# Patient Record
Sex: Male | Born: 1950 | Race: White | Hispanic: No | Marital: Married | Smoking: Never smoker
Health system: Southern US, Community
[De-identification: ages and names within clinical notes are randomized; demographics above are authoritative.]

## PROBLEM LIST (undated history)

## (undated) DIAGNOSIS — N400 Enlarged prostate without lower urinary tract symptoms: Secondary | ICD-10-CM

## (undated) DIAGNOSIS — I10 Essential (primary) hypertension: Secondary | ICD-10-CM

## (undated) DIAGNOSIS — F329 Major depressive disorder, single episode, unspecified: Secondary | ICD-10-CM

## (undated) DIAGNOSIS — I639 Cerebral infarction, unspecified: Secondary | ICD-10-CM

## (undated) DIAGNOSIS — K219 Gastro-esophageal reflux disease without esophagitis: Secondary | ICD-10-CM

## (undated) DIAGNOSIS — D126 Benign neoplasm of colon, unspecified: Secondary | ICD-10-CM

## (undated) DIAGNOSIS — J189 Pneumonia, unspecified organism: Secondary | ICD-10-CM

## (undated) DIAGNOSIS — M1990 Unspecified osteoarthritis, unspecified site: Secondary | ICD-10-CM

## (undated) DIAGNOSIS — I2510 Atherosclerotic heart disease of native coronary artery without angina pectoris: Secondary | ICD-10-CM

## (undated) DIAGNOSIS — N529 Male erectile dysfunction, unspecified: Secondary | ICD-10-CM

## (undated) DIAGNOSIS — C801 Malignant (primary) neoplasm, unspecified: Secondary | ICD-10-CM

## (undated) DIAGNOSIS — E785 Hyperlipidemia, unspecified: Secondary | ICD-10-CM

## (undated) DIAGNOSIS — M109 Gout, unspecified: Secondary | ICD-10-CM

## (undated) DIAGNOSIS — F32A Depression, unspecified: Secondary | ICD-10-CM

## (undated) DIAGNOSIS — F419 Anxiety disorder, unspecified: Secondary | ICD-10-CM

## (undated) DIAGNOSIS — Z87442 Personal history of urinary calculi: Secondary | ICD-10-CM

## (undated) HISTORY — PX: OTHER SURGICAL HISTORY: SHX169

## (undated) HISTORY — DX: Male erectile dysfunction, unspecified: N52.9

## (undated) HISTORY — DX: Gout, unspecified: M10.9

## (undated) HISTORY — DX: Hyperlipidemia, unspecified: E78.5

## (undated) HISTORY — DX: Unspecified osteoarthritis, unspecified site: M19.90

## (undated) HISTORY — DX: Benign prostatic hyperplasia without lower urinary tract symptoms: N40.0

## (undated) HISTORY — PX: ULNAR NERVE REPAIR: SHX2594

## (undated) HISTORY — DX: Essential (primary) hypertension: I10

## (undated) HISTORY — DX: Benign neoplasm of colon, unspecified: D12.6

## (undated) HISTORY — PX: LEG SURGERY: SHX1003

## (undated) HISTORY — DX: Cerebral infarction, unspecified: I63.9

## (undated) HISTORY — PX: TONSILLECTOMY: SUR1361

## (undated) HISTORY — DX: Anxiety disorder, unspecified: F41.9

---

## 1998-12-29 ENCOUNTER — Encounter: Payer: Self-pay | Admitting: *Deleted

## 1998-12-30 ENCOUNTER — Inpatient Hospital Stay (HOSPITAL_COMMUNITY): Admission: EM | Admit: 1998-12-30 | Discharge: 1999-01-04 | Payer: Self-pay | Admitting: *Deleted

## 1998-12-30 ENCOUNTER — Encounter: Payer: Self-pay | Admitting: General Surgery

## 1998-12-30 ENCOUNTER — Encounter: Payer: Self-pay | Admitting: *Deleted

## 2005-07-31 ENCOUNTER — Ambulatory Visit (HOSPITAL_COMMUNITY): Admission: RE | Admit: 2005-07-31 | Discharge: 2005-07-31 | Payer: Self-pay | Admitting: Surgery

## 2005-08-06 ENCOUNTER — Ambulatory Visit (HOSPITAL_COMMUNITY): Admission: RE | Admit: 2005-08-06 | Discharge: 2005-08-06 | Payer: Self-pay | Admitting: Surgery

## 2005-08-07 ENCOUNTER — Ambulatory Visit (HOSPITAL_BASED_OUTPATIENT_CLINIC_OR_DEPARTMENT_OTHER): Admission: RE | Admit: 2005-08-07 | Discharge: 2005-08-07 | Payer: Self-pay | Admitting: Surgery

## 2010-04-20 ENCOUNTER — Encounter: Payer: Self-pay | Admitting: Surgery

## 2011-03-31 DIAGNOSIS — I639 Cerebral infarction, unspecified: Secondary | ICD-10-CM

## 2011-03-31 HISTORY — DX: Cerebral infarction, unspecified: I63.9

## 2013-08-22 DIAGNOSIS — D126 Benign neoplasm of colon, unspecified: Secondary | ICD-10-CM

## 2013-08-22 HISTORY — DX: Benign neoplasm of colon, unspecified: D12.6

## 2016-11-04 ENCOUNTER — Encounter: Payer: Self-pay | Admitting: Nurse Practitioner

## 2016-11-19 ENCOUNTER — Telehealth: Payer: Self-pay | Admitting: Nurse Practitioner

## 2016-11-19 ENCOUNTER — Encounter (INDEPENDENT_AMBULATORY_CARE_PROVIDER_SITE_OTHER): Payer: Self-pay

## 2016-11-19 ENCOUNTER — Ambulatory Visit (INDEPENDENT_AMBULATORY_CARE_PROVIDER_SITE_OTHER): Payer: Medicare Other | Admitting: Nurse Practitioner

## 2016-11-19 ENCOUNTER — Encounter: Payer: Self-pay | Admitting: Nurse Practitioner

## 2016-11-19 VITALS — BP 108/60 | HR 67 | Ht 72.0 in | Wt 199.0 lb

## 2016-11-19 DIAGNOSIS — Z86010 Personal history of colonic polyps: Secondary | ICD-10-CM | POA: Diagnosis not present

## 2016-11-19 DIAGNOSIS — K5900 Constipation, unspecified: Secondary | ICD-10-CM

## 2016-11-19 DIAGNOSIS — K589 Irritable bowel syndrome without diarrhea: Secondary | ICD-10-CM | POA: Diagnosis not present

## 2016-11-19 MED ORDER — LINACLOTIDE 72 MCG PO CAPS: 72.0000 ug | ORAL_CAPSULE | Freq: Every day | ORAL | 2 refills | Status: DC

## 2016-11-19 NOTE — Progress Notes (Addendum)
HPI:  Patient is a 66 year old male, new to this practice, with a history of anxiety, depression, melanoma, CVA, GERD, diverticulosis, IBS, and possibly colon polyps. He is here for evaluation of chronically altered bowel habits. Patient gives a long-standing history of irregular bowel movements dating back to his early twenties. Bowel changes are always associated with life stressors. He was evaluated in 2017 in [place]. I do not have the records but polyps were removed at time of colonoscopy. Patient may have had an upper endoscopy. He was having problems with reflux but after starting baking soda mixed with water his symptoms have resolved. Ultimately patient was felt to have irritable bowel syndrome, Bentyl was prescribed. He comes in today wanting to know what else is available for management of IBS. He takes fiber gummies and uses miralax once a week.He alternates between constipation and diarrhea. He is no longer under much stress, exercising and eating well. He doesn't' understand why bowels are still so irregular.     Past Medical History:  Diagnosis Date  . Anxiety   . Arthritis   . BPH (benign prostatic hyperplasia)   . CVA (cerebral vascular accident) ([place])   . ED (erectile dysfunction)   . Gout   . Hyperlipidemia   . Hypertension      Past Surgical History:  Procedure Laterality Date  . LEG SURGERY    . TONSILLECTOMY     Family History  Problem Relation Age of Onset  . Brain cancer Mother   . COPD Father   . Heart attack Paternal Grandmother   . Heart attack Paternal Grandfather    Social History  Substance Use Topics  . Smoking status: Never Smoker  . Smokeless tobacco: Not on file  . Alcohol use 1.2 oz/week    2 Glasses of wine per week   Current Outpatient Prescriptions  Medication Sig Dispense Refill  . atorvastatin (LIPITOR) 40 MG tablet Take 40 mg by mouth daily.    . diclofenac sodium (VOLTAREN) 1 % GEL Apply 2 g topically 3 (three)  times daily.    Marland Kitchen dicyclomine (BENTYL) 10 MG capsule Take 10 mg by mouth 4 (four) times daily -  before meals and at bedtime.    . hydrOXYzine (ATARAX/VISTARIL) 25 MG tablet Take 25 mg by mouth 3 (three) times daily as needed.    . meloxicam (MOBIC) 15 MG tablet Take 15 mg by mouth daily.    . sertraline (ZOLOFT) 100 MG tablet Take 100 mg by mouth daily.    . tadalafil (CIALIS) 20 MG tablet Take 20 mg by mouth daily as needed for erectile dysfunction.    . traZODone (DESYREL) 100 MG tablet Take 100 mg by mouth at bedtime.     No current facility-administered medications for this visit.    No Known Allergies   Review of Systems: Positive for sinus trouble, anxiety, arthritis, depression, sleeping problems and urine leakage. All systems reviewed and negative except where noted in HPI.    Physical Exam: BP 108/60   Pulse 67   Ht 6' (1.829 m)   Wt 199 lb (90.3 kg)   BMI 26.99 kg/m  Constitutional:  Well-developed, white male in no acute distress. Psychiatric: Normal mood and affect. Behavior is normal. EENT: Pupils normal.  Conjunctivae are normal. No scleral icterus. Neck supple.  Cardiovascular: Normal rate, regular rhythm. No edema Pulmonary/chest: Effort normal and breath sounds normal. No wheezing, rales or rhonchi. Abdominal: Soft, nondistended. Nontender. Bowel sounds active throughout.  There are no masses palpable. No hepatomegaly. Lymphadenopathy: No cervical adenopathy noted. Neurological: Alert and oriented to person place and time. Skin: Skin is warm and dry. No rashes noted.   ASSESSMENT AND PLAN: 45.  33 -year-old male with long-standing constipation / bloating / and loose stools. In retrospect he realizes that loose stools are secondary to laxative. The real issue is constipation. Marland Kitchen   obtain GI records from [place]  -Decreased Bentyl to twice daily on an as-needed basis  -Continue gummy fiber -Trial of Linzess 72 mcg -Follow-up with me or Dr. Carlean Purl in 2 months.  Call if having problems in the interim.  Tye Savoy, NP  11/19/2016, 11:06 AM    Agree with Ms. Murdis Flitton's assessment and plan. Gatha Mayer, MD, [facility]   Addendum.  Received EGD / colonoscopy reports from [facility] in [place].  colonoscopy 08/22/13 for evaluation of bowel changes. Extent of exam was to the cecum. Quality of the bowel prep was fair. Two small sessile polyps were removed from the rectum.  A small sessile polyp removed from hepatic flexure. Nonbleeding internal hemorrhoids were found.  EGD 08/22/13 for evaluation of melena. Findings included medium-size nonbleeding erosions in the prepyloric region. No stigmata of recent bleeding. Biopsies were taken. Salmon mucosa was present in the esophagus, biopsies were taken. First part of duodenum and second part of duodenum were normal. Biopsies were taken for evaluation of celiac disease.   GE junction biopsies showed carditis, increased acute and chronic inflammation. No malignancy.  Gastric biopsy showed benign gastric mucosa with hemorrhage. No H. pylori.  Small bowel biopsy showed mild chronic active duodenitis with increased acute and chronic inflammation. Preserved villous architecture. No features of sprue.  Hepatic flexure polyp was a tubular adenoma.  Rectal polyp was hyperplastic.  Will scan report sent Epic and [place] addendum to Dr. Carlean Purl, patient's primary GI. Probably needs surveillance colonoscopy in 2020 but will defer to Dr. Carlean Purl    Noted  Consider earlier f/u colonoscopy given fair prep Gatha Mayer, MD, [facility]

## 2016-11-19 NOTE — Patient Instructions (Addendum)
If you are age 67 or older, your body mass index should be between 23-30. Your Body mass index is 26.99 kg/m. If this is out of the aforementioned range listed, please consider follow up with your Primary Care Provider.  If you are age 44 or younger, your body mass index should be between 19-25. Your Body mass index is 26.99 kg/m. If this is out of the aformentioned range listed, please consider follow up with your Primary Care Provider.   We have sent the following medications to your pharmacy for you to pick up at your convenience: [facility] card given. DECREASE Bentyl to ONLY twice daily as needed.  Follow up with Dr Carlean Purl on 01/25/17 at 11:00 a.m.  Thank you for choosing me and [facility].   Tye Savoy, NP

## 2016-11-20 NOTE — Telephone Encounter (Signed)
Patient states that he cannot afford Linzess and is requesting something less espensive. Best # [phone].

## 2016-11-24 NOTE — Telephone Encounter (Signed)
Hi Paula, please advise alternative for LInzess , pt could not use coupon. Thanks, Peter Congo

## 2016-11-26 ENCOUNTER — Other Ambulatory Visit: Payer: Self-pay

## 2016-11-26 MED ORDER — LUBIPROSTONE 8 MCG PO CAPS: 8.0000 ug | ORAL_CAPSULE | Freq: Two times a day (BID) | ORAL | 0 refills | Status: DC

## 2016-11-26 NOTE — Telephone Encounter (Signed)
Okay, lets try amitiza 8 mcg BID with food. Thanks # [phone]

## 2016-12-25 ENCOUNTER — Ambulatory Visit: Payer: Self-pay | Admitting: Orthopedic Surgery

## 2016-12-31 ENCOUNTER — Ambulatory Visit: Payer: Self-pay | Admitting: Orthopedic Surgery

## 2016-12-31 NOTE — H&P (Signed)
Harry Olson DOB: 10-22-1950 Separated / Language: Cleophus Molt / Race: White Male Date of Admission:  01/11/2017 CC:  Left knee pain History of Present Illness The patient is a 66 year old male who comes in for a preoperative History and Physical. The patient is scheduled for a total knee arthroplasty to be performed by Dr. Dione Plover. Aluisio, MD at [facility] on 01-11-2017. The patient was seen for a second opinion. The patient reported left knee symptoms including: pain, swelling, instability, giving way and weakness which began year(s) ago without any known injury. The patient describes their pain as sharp, dull, aching and throbbing. The patient feels that the symptoms are worsening. Prior to being seen, the patient was previously evaluated by a colleague [place], [facility]). Previous work-up for this problem has included knee x-rays. Past treatment for this problem has included intra-articular injection of corticosteroids and viscosupplementation. Current treatment includes nonsteroidal anti-inflammatory drugs (Aleve/Ibuprofen). The left knee has been a problem for several years and has been more progressive over the past 6-12 months. He was been followed and treated by his physician in [place], [place] and had received a couple of cortisone injections that helped and was in the process and received two out of a three shot gel series prior to moving to [place]. He was referred to Dr. Wynelle Link by several people.  He describes the pain as sharp at times with a more constant dull ache. He has had to modify his activities over the past two years and has had to modify his workouts. 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 He has had swelling, popping, grinding, but no buckling or giving way. He describes significant stiffness and unable to kneel down. He has now reached a point where he would like to proceed with surgery for the knee. They have been treated  conservatively in the past for the above stated problem and despite conservative measures, they continue to have progressive pain and severe functional limitations and dysfunction. They have failed non-operative management including home exercise, medications, and injections. It is felt that they would benefit from undergoing total joint replacement. Risks and benefits of the procedure have been discussed with the patient and they elect to proceed with surgery. There are no active contraindications to surgery such as ongoing infection or rapidly progressive neurological disease.  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 age 27, smoker, emphysema Mother  Deceased. age 52, Brain Tumor  Social History Post-Surgical Plans  Home With Caregiver, Home with HHPT. Children  2  Medication History Atorvastatin Calcium (40MG  Tablet, Oral) Active. HydrOXYzine Pamoate (25MG  Capsule, Oral) Active. Meloxicam (15MG  Tablet, Oral) Active. Sertraline HCl (100MG  Tablet, Oral) Active. Sildenafil Citrate (20MG  Tablet, Oral) Active. TraZODone HCl (100MG  Tablet, Oral) Active.    Review of Systems  General Not Present- Chills, Fatigue, Fever, Memory Loss, Night Sweats, Weight Gain and Weight Loss. Skin Not Present- Eczema, Hives, Itching, Lesions and Rash. HEENT Not Present- Dentures, Double Vision, Headache, Hearing Loss, Tinnitus and Visual Loss. Respiratory Present- Cough (recent cough but resolved). Not Present- Allergies, Chronic Cough, Coughing up blood, Shortness of breath at rest and Shortness of breath with exertion. Cardiovascular Not Present- Chest Pain, Difficulty Breathing Lying Down, Murmur, Palpitations, Racing/skipping heartbeats and Swelling. Gastrointestinal Not Present- Abdominal Pain,  Bloody Stool, Constipation,  Diarrhea, Difficulty Swallowing, Heartburn, Jaundice, Loss of appetitie, Nausea and Vomiting. Male Genitourinary Not Present- Blood in Urine, Discharge, Flank Pain, Incontinence, Painful Urination, Urgency, Urinary frequency, Urinary Retention, Urinating at Night and Weak urinary stream. Musculoskeletal Present- Joint Pain. Not Present- Back Pain, Joint Swelling, Morning Stiffness, Muscle Pain, Muscle Weakness and Spasms. Neurological Not Present- Blackout spells, Difficulty with balance, Dizziness, Paralysis, Tremor and Weakness. Psychiatric Not Present- Insomnia.  Vitals Weight: 196 lb Height: 70in Weight was reported by patient. Height was reported by patient. Body Surface Area: 2.07 m Body Mass Index: 28.12 kg/m  Pulse: 68 (Regular)  BP: 118/68 (Sitting, Right Arm, Standard)   Physical Exam General Mental Status -Alert, cooperative and good historian. General Appearance-pleasant, Not in acute distress. Orientation-Oriented X3. Build & Nutrition-Well nourished and Well developed.  Head and Neck Head-normocephalic, atraumatic . Neck Global Assessment - supple, no bruit auscultated on the right, no bruit auscultated on the left.  Eye Pupil - Bilateral-Regular and Round. Motion - Bilateral-EOMI.  ENMT Note: upper and lower dentures   Chest and Lung Exam Auscultation Breath sounds - clear at anterior chest wall and clear at posterior chest wall. Adventitious sounds - No Adventitious sounds.  Cardiovascular Auscultation Rhythm - Regular rate and rhythm. Heart Sounds - S1 WNL and S2 WNL. Murmurs & Other Heart Sounds - Auscultation of the heart reveals - No Murmurs.  Abdomen Palpation/Percussion Tenderness - Abdomen is non-tender to palpation. Rigidity (guarding) - Abdomen is soft. Auscultation Auscultation of the abdomen reveals - Bowel sounds normal.  Male Genitourinary Note: Not done, not pertinent to present  illness   Musculoskeletal Note: Well-developed male alert and oriented in no apparent distress. Both hips have normal range of motion. He has significant tenderness over the greater trochanter on both sides.Right knee shows no effusion. Range of motion is 0-125. There is no crepitus on range of motion of the knee. There is no medial or lateral joint line tenderness. There is no instability noted. His LEFT knee shows no effusion. His range is 5-125. There is moderate crepitus on range of motion of the knee. He is tender medial and lateral with no instability noted. He has an antalgic gait pattern on the LEFT.  Radiographs AP and lateral of the knees show RIGHT knee is normal. LEFT knee has bone-on-bone change in the lateral and patellofemoral compartments. AP pelvis and lateral hip show no arthritis acute or chronic bony abnormalities   Assessment & Plan Primary osteoarthritis of left knee (M17.12)  Note:Surgical Plans: Left Total Knee Replacement  Disposition: Home with home, HHPT  PCP: Teodoro Kil, PA-C - Patient has been seen preoperatively and felt to be stable for surgery.  IV TXA  Anesthesia Issues: None  Patient was instructed on what medications to stop prior to surgery.  Signed electronically by Joelene Millin, III PA-C

## 2017-01-01 NOTE — Progress Notes (Signed)
Please place orders in EPIC as patient has a pre-op appointment on 01/05/2017! Thank you!

## 2017-01-01 NOTE — Patient Instructions (Signed)
Harry Olson  01/01/2017   Your procedure is scheduled on: 01/11/17    Report to [facility] Main  Entrance   Report to admitting at   1010 AM   Call this number if you have problems the morning of surgery  [phone]   Remember: ONLY 1 PERSON MAY GO WITH YOU TO SHORT STAY TO GET  READY MORNING OF YOUR SURGERY.  Do not eat food or drink liquids :After Midnight.     Take these medicines the morning of surgery with A SIP OF WATER: Zoloft                                 You may not have any metal on your body including hair pins and              piercings  Do not wear jewelry, , lotions, powders or perfumes, deodorant                      Men may shave face and neck.   Do not bring valuables to the hospital. [place].  Contacts, dentures or bridgework may not be worn into surgery.  Leave suitcase in the car. After surgery it may be brought to your room.                 Please read over the following fact sheets you were given: _____________________________________________________________________             [facility] - Preparing for Surgery Before surgery, you can play an important role.  Because skin is not sterile, your skin needs to be as free of germs as possible.  You can reduce the number of germs on your skin by washing with CHG (chlorahexidine gluconate) soap before surgery.  CHG is an antiseptic cleaner which kills germs and bonds with the skin to continue killing germs even after washing. Please DO NOT use if you have an allergy to CHG or antibacterial soaps.  If your skin becomes reddened/irritated stop using the CHG and inform your nurse when you arrive at Short Stay. Do not shave (including legs and underarms) for at least 48 hours prior to the first CHG shower.  You may shave your face/neck. Please follow these instructions carefully:  1.  Shower with CHG Soap the night before surgery  and the  morning of Surgery.  2.  If you choose to wash your hair, wash your hair first as usual with your  normal  shampoo.  3.  After you shampoo, rinse your hair and body thoroughly to remove the  shampoo.                           4.  Use CHG as you would any other liquid soap.  You can apply chg directly  to the skin and wash                       Gently with a scrungie or clean washcloth.  5.  Apply the CHG Soap to your body ONLY FROM THE NECK DOWN.   Do not use on face/ open  Wound or open sores. Avoid contact with eyes, ears mouth and genitals (private parts).                       Wash face,  Genitals (private parts) with your normal soap.             6.  Wash thoroughly, paying special attention to the area where your surgery  will be performed.  7.  Thoroughly rinse your body with warm water from the neck down.  8.  DO NOT shower/wash with your normal soap after using and rinsing off  the CHG Soap.                9.  Pat yourself dry with a clean towel.            10.  Wear clean pajamas.            11.  Place clean sheets on your bed the night of your first shower and do not  sleep with pets. Day of Surgery : Do not apply any lotions/deodorants the morning of surgery.  Please wear clean clothes to the hospital/surgery center.  FAILURE TO FOLLOW THESE INSTRUCTIONS MAY RESULT IN THE CANCELLATION OF YOUR SURGERY PATIENT SIGNATURE_________________________________  NURSE SIGNATURE__________________________________  ________________________________________________________________________  WHAT IS A BLOOD TRANSFUSION? Blood Transfusion Information  A transfusion is the replacement of blood or some of its parts. Blood is made up of multiple cells which provide different functions.  Red blood cells carry oxygen and are used for blood loss replacement.  White blood cells fight against infection.  Platelets control bleeding.  Plasma helps clot  blood.  Other blood products are available for specialized needs, such as hemophilia or other clotting disorders. BEFORE THE TRANSFUSION  Who gives blood for transfusions?   Healthy volunteers who are fully evaluated to make sure their blood is safe. This is blood bank blood. Transfusion therapy is the safest it has ever been in the practice of medicine. Before blood is taken from a donor, a complete history is taken to make sure that person has no history of diseases nor engages in risky social behavior (examples are intravenous drug use or sexual activity with multiple partners). The donor's travel history is screened to minimize risk of transmitting infections, such as malaria. The donated blood is tested for signs of infectious diseases, such as HIV and hepatitis. The blood is then tested to be sure it is compatible with you in order to minimize the chance of a transfusion reaction. If you or a relative donates blood, this is often done in anticipation of surgery and is not appropriate for emergency situations. It takes many days to process the donated blood. RISKS AND COMPLICATIONS Although transfusion therapy is very safe and saves many lives, the main dangers of transfusion include:   Getting an infectious disease.  Developing a transfusion reaction. This is an allergic reaction to something in the blood you were given. Every precaution is taken to prevent this. The decision to have a blood transfusion has been considered carefully by your caregiver before blood is given. Blood is not given unless the benefits outweigh the risks. AFTER THE TRANSFUSION  Right after receiving a blood transfusion, you will usually feel much better and more energetic. This is especially true if your red blood cells have gotten low (anemic). The transfusion raises the level of the red blood cells which carry oxygen, and this usually causes an energy increase.  The  nurse administering the transfusion will monitor  you carefully for complications. HOME CARE INSTRUCTIONS  No special instructions are needed after a transfusion. You may find your energy is better. Speak with your caregiver about any limitations on activity for underlying diseases you may have. SEEK MEDICAL CARE IF:   Your condition is not improving after your transfusion.  You develop redness or irritation at the intravenous (IV) site. SEEK IMMEDIATE MEDICAL CARE IF:  Any of the following symptoms occur over the next 12 hours:  Shaking chills.  You have a temperature by mouth above 102 F (38.9 C), not controlled by medicine.  Chest, back, or muscle pain.  People around you feel you are not acting correctly or are confused.  Shortness of breath or difficulty breathing.  Dizziness and fainting.  You get a rash or develop hives.  You have a decrease in urine output.  Your urine turns a dark color or changes to pink, red, or brown. Any of the following symptoms occur over the next 10 days:  You have a temperature by mouth above 102 F (38.9 C), not controlled by medicine.  Shortness of breath.  Weakness after normal activity.  The white part of the eye turns yellow (jaundice).  You have a decrease in the amount of urine or are urinating less often.  Your urine turns a dark color or changes to pink, red, or brown. Document Released: 03/13/2000 Document Revised: 06/08/2011 Document Reviewed: 10/31/2007 ExitCare Patient Information 2014 [place].  _______________________________________________________________________  Incentive Spirometer  An incentive spirometer is a tool that can help keep your lungs clear and active. This tool measures how well you are filling your lungs with each breath. Taking long deep breaths may help reverse or decrease the chance of developing breathing (pulmonary) problems (especially infection) following:  A long period of time when you are unable to move or be active. BEFORE THE  PROCEDURE   If the spirometer includes an indicator to show your best effort, your nurse or respiratory therapist will set it to a desired goal.  If possible, sit up straight or lean slightly forward. Try not to slouch.  Hold the incentive spirometer in an upright position. INSTRUCTIONS FOR USE  1. Sit on the edge of your bed if possible, or sit up as far as you can in bed or on a chair. 2. Hold the incentive spirometer in an upright position. 3. Breathe out normally. 4. Place the mouthpiece in your mouth and seal your lips tightly around it. 5. Breathe in slowly and as deeply as possible, raising the piston or the ball toward the top of the column. 6. Hold your breath for 3-5 seconds or for as long as possible. Allow the piston or ball to fall to the bottom of the column. 7. Remove the mouthpiece from your mouth and breathe out normally. 8. Rest for a few seconds and repeat Steps 1 through 7 at least 10 times every 1-2 hours when you are awake. Take your time and take a few normal breaths between deep breaths. 9. The spirometer may include an indicator to show your best effort. Use the indicator as a goal to work toward during each repetition. 10. After each set of 10 deep breaths, practice coughing to be sure your lungs are clear. If you have an incision (the cut made at the time of surgery), support your incision when coughing by placing a pillow or rolled up towels firmly against it. Once you are able to get  out of bed, walk around indoors and cough well. You may stop using the incentive spirometer when instructed by your caregiver.  RISKS AND COMPLICATIONS  Take your time so you do not get dizzy or light-headed.  If you are in pain, you may need to take or ask for pain medication before doing incentive spirometry. It is harder to take a deep breath if you are having pain. AFTER USE  Rest and breathe slowly and easily.  It can be helpful to keep track of a log of your progress. Your  caregiver can provide you with a simple table to help with this. If you are using the spirometer at home, follow these instructions: [place] IF:   You are having difficultly using the spirometer.  You have trouble using the spirometer as often as instructed.  Your pain medication is not giving enough relief while using the spirometer.  You develop fever of 100.5 F (38.1 C) or higher. SEEK IMMEDIATE MEDICAL CARE IF:   You cough up bloody sputum that had not been present before.  You develop fever of 102 F (38.9 C) or greater.  You develop worsening pain at or near the incision site. MAKE SURE YOU:   Understand these instructions.  Will watch your condition.  Will get help right away if you are not doing well or get worse. Document Released: 07/27/2006 Document Revised: 06/08/2011 Document Reviewed: 09/27/2006 [employer] Patient Information 2014 [place], [place].   ________________________________________________________________________

## 2017-01-04 ENCOUNTER — Ambulatory Visit: Payer: Self-pay | Admitting: Orthopedic Surgery

## 2017-01-04 NOTE — Progress Notes (Signed)
Please place orders in EPIC as patient has a pre-op appointment on 01/05/2017! Thank you!

## 2017-01-05 ENCOUNTER — Encounter (HOSPITAL_COMMUNITY): Payer: Self-pay

## 2017-01-05 ENCOUNTER — Encounter (INDEPENDENT_AMBULATORY_CARE_PROVIDER_SITE_OTHER): Payer: Self-pay

## 2017-01-05 ENCOUNTER — Encounter (HOSPITAL_COMMUNITY)
Admission: RE | Admit: 2017-01-05 | Discharge: 2017-01-05 | Disposition: A | Discharge status: Home / Self Care | Payer: Medicare Other | Source: Ambulatory Visit | Attending: Orthopedic Surgery | Admitting: Orthopedic Surgery

## 2017-01-05 ENCOUNTER — Ambulatory Visit: Payer: Self-pay | Admitting: Orthopedic Surgery

## 2017-01-05 DIAGNOSIS — M1712 Unilateral primary osteoarthritis, left knee: Secondary | ICD-10-CM | POA: Diagnosis not present

## 2017-01-05 DIAGNOSIS — Z01812 Encounter for preprocedural laboratory examination: Secondary | ICD-10-CM | POA: Diagnosis present

## 2017-01-05 DIAGNOSIS — Z01810 Encounter for preprocedural cardiovascular examination: Secondary | ICD-10-CM | POA: Insufficient documentation

## 2017-01-05 HISTORY — DX: Malignant (primary) neoplasm, unspecified: C80.1

## 2017-01-05 HISTORY — DX: Depression, unspecified: F32.A

## 2017-01-05 HISTORY — DX: Major depressive disorder, single episode, unspecified: F32.9

## 2017-01-05 HISTORY — DX: Pneumonia, unspecified organism: J18.9

## 2017-01-05 HISTORY — DX: Personal history of urinary calculi: Z87.442

## 2017-01-05 HISTORY — DX: Gastro-esophageal reflux disease without esophagitis: K21.9

## 2017-01-05 LAB — COMPREHENSIVE METABOLIC PANEL
ALBUMIN: 4 g/dL (ref 3.5–5.0)
ALT: 14 U/L — AB (ref 17–63)
AST: 15 U/L (ref 15–41)
Alkaline Phosphatase: 54 U/L (ref 38–126)
Anion gap: 12 (ref 5–15)
BUN: 18 mg/dL (ref 6–20)
CHLORIDE: 106 mmol/L (ref 101–111)
CO2: 24 mmol/L (ref 22–32)
CREATININE: 1.13 mg/dL (ref 0.61–1.24)
Calcium: 9.5 mg/dL (ref 8.9–10.3)
GFR calc Af Amer: >60 mL/min (ref >60)
GLUCOSE: 94 mg/dL (ref 65–99)
POTASSIUM: 3.9 mmol/L (ref 3.5–5.1)
Sodium: 142 mmol/L (ref 135–145)
Total Bilirubin: 1.3 mg/dL — ABNORMAL HIGH (ref 0.3–1.2)
Total Protein: 7 g/dL (ref 6.5–8.1)

## 2017-01-05 LAB — CBC
HEMATOCRIT: 42.9 % (ref 39.0–52.0)
Hemoglobin: 14.2 g/dL (ref 13.0–17.0)
MCH: 29.4 pg (ref 26.0–34.0)
MCHC: 33.1 g/dL (ref 30.0–36.0)
MCV: 88.8 fL (ref 78.0–100.0)
PLATELETS: 182 10*3/uL (ref 150–400)
RBC: 4.83 MIL/uL (ref 4.22–5.81)
RDW: 13 % (ref 11.5–15.5)
WBC: 6.8 10*3/uL (ref 4.0–10.5)

## 2017-01-05 LAB — SURGICAL PCR SCREEN
MRSA, PCR: NEGATIVE
STAPHYLOCOCCUS AUREUS: POSITIVE — AB

## 2017-01-05 LAB — PROTIME-INR
INR: 0.91
PROTHROMBIN TIME: 12.2 s (ref 11.4–15.2)

## 2017-01-05 LAB — APTT: APTT: 37 s — AB (ref 24–36)

## 2017-01-05 LAB — ABO/RH: ABO/RH(D): O POS

## 2017-01-05 NOTE — Progress Notes (Addendum)
LOV- 7/25/18[name]  In epic  Clearance- Harry Olson on chart 10/21/16

## 2017-01-05 NOTE — Progress Notes (Signed)
Clearance- 10/25/2016- Abbe Amsterdam on chart

## 2017-01-05 NOTE — Progress Notes (Signed)
Final EKG done 01/05/17-epic

## 2017-01-05 NOTE — Progress Notes (Signed)
PTT done 01/05/2017 faxed via epic to Dr Wynelle Link.

## 2017-01-06 NOTE — Progress Notes (Deleted)
Requeested LOV note from [facility].

## 2017-01-11 ENCOUNTER — Encounter (HOSPITAL_COMMUNITY): Payer: Self-pay

## 2017-01-11 ENCOUNTER — Inpatient Hospital Stay (HOSPITAL_COMMUNITY): Payer: Medicare Other | Admitting: Registered Nurse

## 2017-01-11 ENCOUNTER — Inpatient Hospital Stay (HOSPITAL_COMMUNITY)
Admission: RE | Admit: 2017-01-11 | Discharge: 2017-01-13 | DRG: 470 | Disposition: A | Discharge status: Home / Self Care | Payer: Medicare Other | Source: Ambulatory Visit | Attending: Orthopedic Surgery | Admitting: Orthopedic Surgery

## 2017-01-11 ENCOUNTER — Encounter (HOSPITAL_COMMUNITY)
Admission: RE | Disposition: A | Discharge status: Home / Self Care | Payer: Self-pay | Source: Ambulatory Visit | Attending: Orthopedic Surgery

## 2017-01-11 DIAGNOSIS — Z825 Family history of asthma and other chronic lower respiratory diseases: Secondary | ICD-10-CM

## 2017-01-11 DIAGNOSIS — Z87442 Personal history of urinary calculi: Secondary | ICD-10-CM | POA: Diagnosis not present

## 2017-01-11 DIAGNOSIS — M109 Gout, unspecified: Secondary | ICD-10-CM | POA: Diagnosis present

## 2017-01-11 DIAGNOSIS — Z85820 Personal history of malignant melanoma of skin: Secondary | ICD-10-CM

## 2017-01-11 DIAGNOSIS — Z8673 Personal history of transient ischemic attack (TIA), and cerebral infarction without residual deficits: Secondary | ICD-10-CM | POA: Diagnosis not present

## 2017-01-11 DIAGNOSIS — Z8701 Personal history of pneumonia (recurrent): Secondary | ICD-10-CM

## 2017-01-11 DIAGNOSIS — M1712 Unilateral primary osteoarthritis, left knee: Principal | ICD-10-CM | POA: Diagnosis present

## 2017-01-11 DIAGNOSIS — M1710 Unilateral primary osteoarthritis, unspecified knee: Secondary | ICD-10-CM | POA: Diagnosis present

## 2017-01-11 DIAGNOSIS — G8929 Other chronic pain: Secondary | ICD-10-CM | POA: Diagnosis present

## 2017-01-11 DIAGNOSIS — M179 Osteoarthritis of knee, unspecified: Secondary | ICD-10-CM

## 2017-01-11 HISTORY — PX: TOTAL KNEE ARTHROPLASTY: SHX125

## 2017-01-11 LAB — TYPE AND SCREEN
ABO/RH(D): O POS
ANTIBODY SCREEN: NEGATIVE

## 2017-01-11 SURGERY — ARTHROPLASTY, KNEE, TOTAL
Anesthesia: Spinal | Site: Knee | Laterality: Left

## 2017-01-11 MED ORDER — ONDANSETRON HCL 4 MG PO TABS: 4.0000 mg | ORAL_TABLET | Freq: Four times a day (QID) | ORAL | Status: DC | PRN

## 2017-01-11 MED ORDER — FLEET ENEMA 7-19 GM/118ML RE ENEM: 1.0000 | ENEMA | Freq: Once | RECTAL | Status: DC | PRN

## 2017-01-11 MED ORDER — LACTATED RINGERS IV SOLN: INTRAVENOUS | Status: DC

## 2017-01-11 MED ORDER — LIDOCAINE 2% (20 MG/ML) 5 ML SYRINGE
INTRAMUSCULAR | Status: DC | PRN
  Administered 2017-01-11: 40 mg via INTRAVENOUS

## 2017-01-11 MED ORDER — SODIUM CHLORIDE 0.9 % IR SOLN
Status: DC | PRN
  Administered 2017-01-11: 1000 mL

## 2017-01-11 MED ORDER — CHLORHEXIDINE GLUCONATE 4 % EX LIQD: 60.0000 mL | Freq: Once | CUTANEOUS | Status: DC

## 2017-01-11 MED ORDER — ACETAMINOPHEN 10 MG/ML IV SOLN
INTRAVENOUS | Status: AC
  Filled 2017-01-11: qty 100

## 2017-01-11 MED ORDER — GABAPENTIN 300 MG PO CAPS: 300.0000 mg | ORAL_CAPSULE | Freq: Once | ORAL | Status: DC

## 2017-01-11 MED ORDER — ACETAMINOPHEN 10 MG/ML IV SOLN: 1000.0000 mg | Freq: Once | INTRAVENOUS | Status: DC

## 2017-01-11 MED ORDER — CEFAZOLIN SODIUM-DEXTROSE 2-4 GM/100ML-% IV SOLN
2.0000 g | Freq: Four times a day (QID) | INTRAVENOUS | Status: AC
  Administered 2017-01-11 – 2017-01-12 (×2): 2 g via INTRAVENOUS
  Filled 2017-01-11 (×2): qty 100

## 2017-01-11 MED ORDER — LACTATED RINGERS IV SOLN
INTRAVENOUS | Status: DC
  Administered 2017-01-11 (×2): via INTRAVENOUS

## 2017-01-11 MED ORDER — MORPHINE SULFATE (PF) 4 MG/ML IV SOLN
1.0000 mg | INTRAVENOUS | Status: DC | PRN
  Administered 2017-01-11 (×2): 1 mg via INTRAVENOUS
  Filled 2017-01-11 (×3): qty 1

## 2017-01-11 MED ORDER — ONDANSETRON HCL 4 MG/2ML IJ SOLN
INTRAMUSCULAR | Status: AC
  Filled 2017-01-11: qty 2

## 2017-01-11 MED ORDER — SERTRALINE HCL 100 MG PO TABS
100.0000 mg | ORAL_TABLET | Freq: Every day | ORAL | Status: DC
  Administered 2017-01-12 – 2017-01-13 (×2): 100 mg via ORAL
  Filled 2017-01-11 (×2): qty 1

## 2017-01-11 MED ORDER — ONDANSETRON HCL 4 MG/2ML IJ SOLN
4.0000 mg | Freq: Four times a day (QID) | INTRAMUSCULAR | Status: DC | PRN
  Administered 2017-01-12: 11:00:00 4 mg via INTRAVENOUS
  Filled 2017-01-11: qty 2

## 2017-01-11 MED ORDER — DEXAMETHASONE SODIUM PHOSPHATE 10 MG/ML IJ SOLN: 10.0000 mg | Freq: Once | INTRAMUSCULAR | Status: DC

## 2017-01-11 MED ORDER — CEFAZOLIN SODIUM-DEXTROSE 2-4 GM/100ML-% IV SOLN
INTRAVENOUS | Status: AC
  Filled 2017-01-11: qty 100

## 2017-01-11 MED ORDER — PROMETHAZINE HCL 25 MG/ML IJ SOLN: 6.2500 mg | INTRAMUSCULAR | Status: DC | PRN

## 2017-01-11 MED ORDER — BUPIVACAINE IN DEXTROSE 0.75-8.25 % IT SOLN
INTRATHECAL | Status: DC | PRN
  Administered 2017-01-11: 2 mL via INTRATHECAL

## 2017-01-11 MED ORDER — PROPOFOL 10 MG/ML IV BOLUS
INTRAVENOUS | Status: DC | PRN
  Administered 2017-01-11 (×2): 20 mg via INTRAVENOUS

## 2017-01-11 MED ORDER — MIDAZOLAM HCL 2 MG/2ML IJ SOLN
2.0000 mg | Freq: Once | INTRAMUSCULAR | Status: AC
  Administered 2017-01-11: 1 mg via INTRAVENOUS

## 2017-01-11 MED ORDER — DIPHENHYDRAMINE HCL 12.5 MG/5ML PO ELIX: 12.5000 mg | ORAL_SOLUTION | ORAL | Status: DC | PRN

## 2017-01-11 MED ORDER — ATORVASTATIN CALCIUM 40 MG PO TABS
40.0000 mg | ORAL_TABLET | ORAL | Status: DC
  Administered 2017-01-11 – 2017-01-12 (×2): 40 mg via ORAL
  Filled 2017-01-11 (×2): qty 1

## 2017-01-11 MED ORDER — METOCLOPRAMIDE HCL 5 MG/ML IJ SOLN: 5.0000 mg | Freq: Three times a day (TID) | INTRAMUSCULAR | Status: DC | PRN

## 2017-01-11 MED ORDER — BUPIVACAINE LIPOSOME 1.3 % IJ SUSP
20.0000 mL | Freq: Once | INTRAMUSCULAR | Status: DC
  Filled 2017-01-11: qty 20

## 2017-01-11 MED ORDER — METOCLOPRAMIDE HCL 5 MG PO TABS: 5.0000 mg | ORAL_TABLET | Freq: Three times a day (TID) | ORAL | Status: DC | PRN

## 2017-01-11 MED ORDER — GABAPENTIN 300 MG PO CAPS
300.0000 mg | ORAL_CAPSULE | Freq: Once | ORAL | Status: AC
  Administered 2017-01-11: 300 mg via ORAL

## 2017-01-11 MED ORDER — METHOCARBAMOL 500 MG PO TABS
500.0000 mg | ORAL_TABLET | Freq: Four times a day (QID) | ORAL | Status: DC | PRN
  Administered 2017-01-11 – 2017-01-13 (×7): 500 mg via ORAL
  Filled 2017-01-11 (×7): qty 1

## 2017-01-11 MED ORDER — BUPIVACAINE LIPOSOME 1.3 % IJ SUSP: 20.0000 mL | Freq: Once | INTRAMUSCULAR | Status: DC

## 2017-01-11 MED ORDER — ACETAMINOPHEN 10 MG/ML IV SOLN
1000.0000 mg | Freq: Once | INTRAVENOUS | Status: AC
  Administered 2017-01-11: 1000 mg via INTRAVENOUS

## 2017-01-11 MED ORDER — DOCUSATE SODIUM 100 MG PO CAPS
100.0000 mg | ORAL_CAPSULE | Freq: Two times a day (BID) | ORAL | Status: DC
  Administered 2017-01-11 – 2017-01-13 (×4): 100 mg via ORAL
  Filled 2017-01-11 (×4): qty 1

## 2017-01-11 MED ORDER — ACETAMINOPHEN 325 MG PO TABS
650.0000 mg | ORAL_TABLET | Freq: Four times a day (QID) | ORAL | Status: DC | PRN
  Administered 2017-01-13: 01:00:00 650 mg via ORAL
  Filled 2017-01-11: qty 2

## 2017-01-11 MED ORDER — DEXAMETHASONE SODIUM PHOSPHATE 10 MG/ML IJ SOLN
10.0000 mg | Freq: Once | INTRAMUSCULAR | Status: AC
  Administered 2017-01-11: 10 mg via INTRAVENOUS

## 2017-01-11 MED ORDER — ACETAMINOPHEN 650 MG RE SUPP
650.0000 mg | Freq: Four times a day (QID) | RECTAL | Status: DC | PRN
  Filled 2017-01-11: qty 1

## 2017-01-11 MED ORDER — CEFAZOLIN SODIUM-DEXTROSE 2-4 GM/100ML-% IV SOLN
2.0000 g | INTRAVENOUS | Status: AC
  Administered 2017-01-11: 2 g via INTRAVENOUS

## 2017-01-11 MED ORDER — BISACODYL 10 MG RE SUPP: 10.0000 mg | Freq: Every day | RECTAL | Status: DC | PRN

## 2017-01-11 MED ORDER — ONDANSETRON HCL 4 MG/2ML IJ SOLN
INTRAMUSCULAR | Status: DC | PRN
  Administered 2017-01-11: 4 mg via INTRAVENOUS

## 2017-01-11 MED ORDER — MIDAZOLAM HCL 2 MG/2ML IJ SOLN
INTRAMUSCULAR | Status: AC
  Administered 2017-01-11: 1 mg via INTRAVENOUS
  Filled 2017-01-11: qty 2

## 2017-01-11 MED ORDER — TRANEXAMIC ACID 1000 MG/10ML IV SOLN
1000.0000 mg | Freq: Once | INTRAVENOUS | Status: AC
  Administered 2017-01-11: 1000 mg via INTRAVENOUS
  Filled 2017-01-11: qty 1100

## 2017-01-11 MED ORDER — PROPOFOL 500 MG/50ML IV EMUL
INTRAVENOUS | Status: DC | PRN
  Administered 2017-01-11: 75 ug/kg/min via INTRAVENOUS

## 2017-01-11 MED ORDER — POLYETHYLENE GLYCOL 3350 17 G PO PACK: 17.0000 g | PACK | Freq: Every day | ORAL | Status: DC | PRN

## 2017-01-11 MED ORDER — RIVAROXABAN 10 MG PO TABS
10.0000 mg | ORAL_TABLET | Freq: Every day | ORAL | Status: DC
  Administered 2017-01-12 – 2017-01-13 (×2): 10 mg via ORAL
  Filled 2017-01-11 (×2): qty 1

## 2017-01-11 MED ORDER — SODIUM CHLORIDE 0.9 % IV SOLN
INTRAVENOUS | Status: DC
  Administered 2017-01-11: 18:00:00 via INTRAVENOUS

## 2017-01-11 MED ORDER — GABAPENTIN 300 MG PO CAPS
ORAL_CAPSULE | ORAL | Status: AC
  Administered 2017-01-11: 300 mg via ORAL
  Filled 2017-01-11: qty 1

## 2017-01-11 MED ORDER — SODIUM CHLORIDE 0.9 % IJ SOLN
INTRAMUSCULAR | Status: AC
  Filled 2017-01-11: qty 50

## 2017-01-11 MED ORDER — DEXAMETHASONE SODIUM PHOSPHATE 10 MG/ML IJ SOLN
10.0000 mg | Freq: Once | INTRAMUSCULAR | Status: AC
  Administered 2017-01-12: 10 mg via INTRAVENOUS
  Filled 2017-01-11: qty 1

## 2017-01-11 MED ORDER — HYDROMORPHONE HCL-NACL 0.5-0.9 MG/ML-% IV SOSY
0.2500 mg | PREFILLED_SYRINGE | INTRAVENOUS | Status: DC | PRN
  Administered 2017-01-11: 0.5 mg via INTRAVENOUS

## 2017-01-11 MED ORDER — LIDOCAINE 2% (20 MG/ML) 5 ML SYRINGE
INTRAMUSCULAR | Status: AC
  Filled 2017-01-11: qty 5

## 2017-01-11 MED ORDER — FENTANYL CITRATE (PF) 100 MCG/2ML IJ SOLN
100.0000 ug | Freq: Once | INTRAMUSCULAR | Status: AC
Start: 2017-01-11 — End: 2017-01-11
  Administered 2017-01-11: 50 ug via INTRAVENOUS

## 2017-01-11 MED ORDER — OXYCODONE HCL 5 MG PO TABS
5.0000 mg | ORAL_TABLET | ORAL | Status: DC | PRN
  Administered 2017-01-11 – 2017-01-12 (×9): 10 mg via ORAL
  Filled 2017-01-11 (×9): qty 2

## 2017-01-11 MED ORDER — BUPIVACAINE LIPOSOME 1.3 % IJ SUSP
INTRAMUSCULAR | Status: DC | PRN
  Administered 2017-01-11: 20 mL

## 2017-01-11 MED ORDER — ACETAMINOPHEN 500 MG PO TABS
1000.0000 mg | ORAL_TABLET | Freq: Four times a day (QID) | ORAL | Status: AC
  Administered 2017-01-11 – 2017-01-12 (×4): 1000 mg via ORAL
  Filled 2017-01-11 (×4): qty 2

## 2017-01-11 MED ORDER — DEXAMETHASONE SODIUM PHOSPHATE 10 MG/ML IJ SOLN
INTRAMUSCULAR | Status: AC
  Filled 2017-01-11: qty 1

## 2017-01-11 MED ORDER — PHENOL 1.4 % MT LIQD: 1.0000 | OROMUCOSAL | Status: DC | PRN

## 2017-01-11 MED ORDER — DICYCLOMINE HCL 10 MG PO CAPS
10.0000 mg | ORAL_CAPSULE | Freq: Four times a day (QID) | ORAL | Status: DC | PRN
  Filled 2017-01-11: qty 1

## 2017-01-11 MED ORDER — 0.9 % SODIUM CHLORIDE (POUR BTL) OPTIME
TOPICAL | Status: DC | PRN
  Administered 2017-01-11: 1000 mL

## 2017-01-11 MED ORDER — MENTHOL 3 MG MT LOZG: 1.0000 | LOZENGE | OROMUCOSAL | Status: DC | PRN

## 2017-01-11 MED ORDER — TRANEXAMIC ACID 1000 MG/10ML IV SOLN
1000.0000 mg | INTRAVENOUS | Status: AC
  Administered 2017-01-11: 1000 mg via INTRAVENOUS
  Filled 2017-01-11: qty 1100

## 2017-01-11 MED ORDER — ROPIVACAINE HCL 7.5 MG/ML IJ SOLN
INTRAMUSCULAR | Status: DC | PRN
  Administered 2017-01-11: 20 mL via PERINEURAL

## 2017-01-11 MED ORDER — HYDROMORPHONE HCL-NACL 0.5-0.9 MG/ML-% IV SOSY
PREFILLED_SYRINGE | INTRAVENOUS | Status: AC
  Filled 2017-01-11: qty 1

## 2017-01-11 MED ORDER — HYDROXYZINE HCL 25 MG PO TABS
25.0000 mg | ORAL_TABLET | Freq: Three times a day (TID) | ORAL | Status: DC | PRN
  Administered 2017-01-11 – 2017-01-12 (×2): 25 mg via ORAL
  Filled 2017-01-11 (×2): qty 1

## 2017-01-11 MED ORDER — FENTANYL CITRATE (PF) 100 MCG/2ML IJ SOLN
INTRAMUSCULAR | Status: AC
  Administered 2017-01-11: 50 ug via INTRAVENOUS
  Filled 2017-01-11: qty 2

## 2017-01-11 MED ORDER — METHOCARBAMOL 1000 MG/10ML IJ SOLN
500.0000 mg | Freq: Four times a day (QID) | INTRAMUSCULAR | Status: DC | PRN
  Administered 2017-01-11: 500 mg via INTRAVENOUS
  Filled 2017-01-11: qty 550

## 2017-01-11 MED ORDER — SODIUM CHLORIDE 0.9 % IJ SOLN
INTRAMUSCULAR | Status: DC | PRN
  Administered 2017-01-11: 60 mL

## 2017-01-11 MED ORDER — CEFAZOLIN SODIUM-DEXTROSE 2-4 GM/100ML-% IV SOLN: 2.0000 g | INTRAVENOUS | Status: DC

## 2017-01-11 MED ORDER — PROPOFOL 10 MG/ML IV BOLUS
INTRAVENOUS | Status: AC
  Filled 2017-01-11: qty 60

## 2017-01-11 MED ORDER — TRAZODONE HCL 100 MG PO TABS
100.0000 mg | ORAL_TABLET | Freq: Every day | ORAL | Status: DC
  Administered 2017-01-11 – 2017-01-12 (×2): 100 mg via ORAL
  Filled 2017-01-11 (×2): qty 1

## 2017-01-11 SURGICAL SUPPLY — 50 items
BAG DECANTER FOR FLEXI CONT (MISCELLANEOUS) ×1 IMPLANT
BAG SPEC THK2 15X12 ZIP CLS (MISCELLANEOUS) ×1
BAG ZIPLOCK 12X15 (MISCELLANEOUS) ×2 IMPLANT
BANDAGE ACE 6X5 VEL STRL LF (GAUZE/BANDAGES/DRESSINGS) ×2 IMPLANT
BLADE SAG 18X100X1.27 (BLADE) ×2 IMPLANT
BLADE SAW SGTL 11.0X1.19X90.0M (BLADE) ×2 IMPLANT
BOWL SMART MIX CTS (DISPOSABLE) ×2 IMPLANT
CAPT KNEE TOTAL 3 ATTUNE ×1 IMPLANT
CEMENT HV SMART SET (Cement) ×4 IMPLANT
COVER SURGICAL LIGHT HANDLE (MISCELLANEOUS) ×2 IMPLANT
CUFF TOURN SGL QUICK 34 (TOURNIQUET CUFF) ×2
CUFF TRNQT CYL 34X4X40X1 (TOURNIQUET CUFF) ×1 IMPLANT
DECANTER SPIKE VIAL GLASS SM (MISCELLANEOUS) ×2 IMPLANT
DRAPE U-SHAPE 47X51 STRL (DRAPES) ×2 IMPLANT
DRSG ADAPTIC 3X8 NADH LF (GAUZE/BANDAGES/DRESSINGS) ×2 IMPLANT
DRSG PAD ABDOMINAL 8X10 ST (GAUZE/BANDAGES/DRESSINGS) ×1 IMPLANT
DURAPREP 26ML APPLICATOR (WOUND CARE) ×2 IMPLANT
ELECT REM PT RETURN 15FT ADLT (MISCELLANEOUS) ×2 IMPLANT
EVACUATOR 1/8 PVC DRAIN (DRAIN) ×2 IMPLANT
GAUZE SPONGE 4X4 12PLY STRL (GAUZE/BANDAGES/DRESSINGS) ×2 IMPLANT
GLOVE BIO SURGEON STRL SZ7.5 (GLOVE) IMPLANT
GLOVE BIO SURGEON STRL SZ8 (GLOVE) ×2 IMPLANT
GLOVE BIOGEL PI IND STRL 6.5 (GLOVE) IMPLANT
GLOVE BIOGEL PI IND STRL 8 (GLOVE) ×1 IMPLANT
GLOVE BIOGEL PI INDICATOR 6.5 (GLOVE)
GLOVE BIOGEL PI INDICATOR 8 (GLOVE) ×1
GLOVE SURG SS PI 6.5 STRL IVOR (GLOVE) IMPLANT
GOWN STRL REUS W/TWL LRG LVL3 (GOWN DISPOSABLE) ×2 IMPLANT
GOWN STRL REUS W/TWL XL LVL3 (GOWN DISPOSABLE) IMPLANT
HANDPIECE INTERPULSE COAX TIP (DISPOSABLE) ×2
IMMOBILIZER KNEE 20 (SOFTGOODS) ×1 IMPLANT
IMMOBILIZER KNEE 20 THIGH 36 (SOFTGOODS) ×1 IMPLANT
MANIFOLD NEPTUNE II (INSTRUMENTS) ×2 IMPLANT
NS IRRIG 1000ML POUR BTL (IV SOLUTION) ×2 IMPLANT
PACK TOTAL KNEE CUSTOM (KITS) ×2 IMPLANT
PAD ABD 8X10 STRL (GAUZE/BANDAGES/DRESSINGS) ×1 IMPLANT
PADDING CAST COTTON 6X4 STRL (CAST SUPPLIES) ×5 IMPLANT
POSITIONER SURGICAL ARM (MISCELLANEOUS) ×2 IMPLANT
SET HNDPC FAN SPRY TIP SCT (DISPOSABLE) ×1 IMPLANT
STRIP CLOSURE SKIN 1/2X4 (GAUZE/BANDAGES/DRESSINGS) ×4 IMPLANT
SUT MNCRL AB 4-0 PS2 18 (SUTURE) ×2 IMPLANT
SUT STRATAFIX 0 PDS 27 VIOLET (SUTURE) ×2
SUT VIC AB 2-0 CT1 27 (SUTURE) ×6
SUT VIC AB 2-0 CT1 TAPERPNT 27 (SUTURE) ×3 IMPLANT
SUTURE STRATFX 0 PDS 27 VIOLET (SUTURE) ×1 IMPLANT
SYR 30ML LL (SYRINGE) ×4 IMPLANT
TRAY FOLEY W/METER SILVER 16FR (SET/KITS/TRAYS/PACK) ×2 IMPLANT
WATER STERILE IRR 1000ML POUR (IV SOLUTION) ×4 IMPLANT
WRAP KNEE MAXI GEL POST OP (GAUZE/BANDAGES/DRESSINGS) ×2 IMPLANT
YANKAUER SUCT BULB TIP 10FT TU (MISCELLANEOUS) ×2 IMPLANT

## 2017-01-11 NOTE — Anesthesia Procedure Notes (Signed)
Anesthesia Procedure Image    

## 2017-01-11 NOTE — Discharge Instructions (Addendum)
°  ° °Dr. Frank Aluisio °Total Joint Specialist °[facility] °[address] °[place], [place] [address] °[phone] ° °TOTAL KNEE REPLACEMENT POSTOPERATIVE DIRECTIONS ° °Knee Rehabilitation, Guidelines Following Surgery  °Results after knee surgery are often greatly improved when you follow the exercise, range of motion and muscle strengthening exercises prescribed by your doctor. Safety measures are also important to protect the knee from further injury. Any time any of these exercises cause you to have increased pain or swelling in your knee joint, decrease the amount until you are comfortable again and slowly increase them. If you have problems or questions, call your caregiver or physical therapist for advice.  ° °HOME CARE INSTRUCTIONS  °Remove items at home which could result in a fall. This includes throw rugs or furniture in walking pathways.  °· ICE to the affected knee every three hours for 30 minutes at a time and then as needed for pain and swelling.  Continue to use ice on the knee for pain and swelling from surgery. You may notice swelling that will progress down to the foot and ankle.  This is normal after surgery.  Elevate the leg when you are not up walking on it.   °· Continue to use the breathing machine which will help keep your temperature down.  It is common for your temperature to cycle up and down following surgery, especially at night when you are not up moving around and exerting yourself.  The breathing machine keeps your lungs expanded and your temperature down. °· Do not place pillow under knee, focus on keeping the knee straight while resting ° °DIET °You may resume your previous home diet once your are discharged from the hospital. ° °DRESSING / WOUND CARE / SHOWERING °You may shower 3 days after surgery, but keep the wounds dry during showering.  You may use an occlusive plastic wrap (Press'n Seal for example), NO SOAKING/SUBMERGING IN THE BATHTUB.  If the  bandage gets wet, change with a clean dry gauze.  If the incision gets wet, pat the wound dry with a clean towel. °You may start showering once you are discharged home but do not submerge the incision under water. Just pat the incision dry and apply a dry gauze dressing on daily. °Change the surgical dressing daily and reapply a dry dressing each time. ° °ACTIVITY °Walk with your walker as instructed. °Use walker as long as suggested by your caregivers. °Avoid periods of inactivity such as sitting longer than an hour when not asleep. This helps prevent blood clots.  °You may resume a sexual relationship in one month or when given the OK by your doctor.  °You may return to work once you are cleared by your doctor.  °Do not drive a car for 6 weeks or until released by you surgeon.  °Do not drive while taking narcotics. ° °WEIGHT BEARING °Weight bearing as tolerated with assist device (walker, cane, etc) as directed, use it as long as suggested by your surgeon or therapist, typically at least 4-6 weeks. ° °POSTOPERATIVE CONSTIPATION PROTOCOL °Constipation - defined medically as fewer than three stools per week and severe constipation as less than one stool per week. ° °One of the most common issues patients have following surgery is constipation.  Even if you have a regular bowel pattern at home, your normal regimen is likely to be disrupted due to multiple reasons following surgery.  Combination of anesthesia, postoperative narcotics, change in appetite and fluid intake all can affect your   bowels.  In order to avoid complications following surgery, here are some recommendations in order to help you during your recovery period. ° °Colace (docusate) - Pick up an over-the-counter form of Colace or another stool softener and take twice a day as long as you are requiring postoperative pain medications.  Take with a full glass of water daily.  If you experience loose stools or diarrhea, hold the colace until you stool forms  back up.  If your symptoms do not get better within 1 week or if they get worse, check with your doctor. ° °Dulcolax (bisacodyl) - Pick up over-the-counter and take as directed by the product packaging as needed to assist with the movement of your bowels.  Take with a full glass of water.  Use this product as needed if not relieved by Colace only.  ° °MiraLax (polyethylene glycol) - Pick up over-the-counter to have on hand.  MiraLax is a solution that will increase the amount of water in your bowels to assist with bowel movements.  Take as directed and can mix with a glass of water, juice, soda, coffee, or tea.  Take if you go more than two days without a movement. °Do not use MiraLax more than once per day. Call your doctor if you are still constipated or irregular after using this medication for 7 days in a row. ° °If you continue to have problems with postoperative constipation, please contact the office for further assistance and recommendations.  If you experience "the worst abdominal pain ever" or develop nausea or vomiting, please contact the office immediatly for further recommendations for treatment. ° °ITCHING ° If you experience itching with your medications, try taking only a single pain pill, or even half a pain pill at a time.  You can also use Benadryl over the counter for itching or also to help with sleep.  ° °TED HOSE STOCKINGS °Wear the elastic stockings on both legs for three weeks following surgery during the day but you may remove then at night for sleeping. ° °MEDICATIONS °See your medication summary on the “After Visit Summary” that the nursing staff will review with you prior to discharge.  You may have some home medications which will be placed on hold until you complete the course of blood thinner medication.  It is important for you to complete the blood thinner medication as prescribed by your surgeon.  Continue your approved medications as instructed at time of  discharge. ° °PRECAUTIONS °If you experience chest pain or shortness of breath - call 911 immediately for transfer to the hospital emergency department.  °If you develop a fever greater that 101 F, purulent drainage from wound, increased redness or drainage from wound, foul odor from the wound/dressing, or calf pain - CONTACT YOUR SURGEON.   °                                                °FOLLOW-UP APPOINTMENTS °Make sure you keep all of your appointments after your operation with your surgeon and caregivers. You should call the office at the above phone number and make an appointment for approximately two weeks after the date of your surgery or on the date instructed by your surgeon outlined in the "After Visit Summary". ° ° °RANGE OF MOTION AND STRENGTHENING EXERCISES  °Rehabilitation of the knee is important following a knee   injury or an operation. After just a few days of immobilization, the muscles of the thigh which control the knee become weakened and shrink (atrophy). Knee exercises are designed to build up the tone and strength of the thigh muscles and to improve knee motion. Often times heat used for twenty to thirty minutes before working out will loosen up your tissues and help with improving the range of motion but do not use heat for the first two weeks following surgery. These exercises can be done on a training (exercise) mat, on the floor, on a table or on a bed. Use what ever works the best and is most comfortable for you Knee exercises include:  °Leg Lifts - While your knee is still immobilized in a splint or cast, you can do straight leg raises. Lift the leg to 60 degrees, hold for 3 sec, and slowly lower the leg. Repeat 10-20 times 2-3 times daily. Perform this exercise against resistance later as your knee gets better.  °Quad and Hamstring Sets - Tighten up the muscle on the front of the thigh (Quad) and hold for 5-10 sec. Repeat this 10-20 times hourly. Hamstring sets are done by pushing the  foot backward against an object and holding for 5-10 sec. Repeat as with quad sets.  °· Leg Slides: Lying on your back, slowly slide your foot toward your buttocks, bending your knee up off the floor (only go as far as is comfortable). Then slowly slide your foot back down until your leg is flat on the floor again. °· Angel Wings: Lying on your back spread your legs to the side as far apart as you can without causing discomfort.  °A rehabilitation program following serious knee injuries can speed recovery and prevent re-injury in the future due to weakened muscles. Contact your doctor or a physical therapist for more information on knee rehabilitation.  ° °IF YOU ARE TRANSFERRED TO A SKILLED REHAB FACILITY °If the patient is transferred to a skilled rehab facility following release from the hospital, a list of the current medications will be sent to the facility for the patient to continue.  When discharged from the skilled rehab facility, please have the facility set up the patient's Home Health Physical Therapy prior to being released. Also, the skilled facility will be responsible for providing the patient with their medications at time of release from the facility to include their pain medication, the muscle relaxants, and their blood thinner medication. If the patient is still at the rehab facility at time of the two week follow up appointment, the skilled rehab facility will also need to assist the patient in arranging follow up appointment in our office and any transportation needs. ° °MAKE SURE YOU:  °Understand these instructions.  °Get help right away if you are not doing well or get worse.  ° ° °Pick up stool softner and laxative for home use following surgery while on pain medications. °Do not submerge incision under water. °Please use good hand washing techniques while changing dressing each day. °May shower starting three days after surgery. °Please use a clean towel to pat the incision dry following  showers. °Continue to use ice for pain and swelling after surgery. °Do not use any lotions or creams on the incision until instructed by your surgeon. ° °Take Xarelto for two and a half more weeks following discharge from the hospital, then discontinue Xarelto. °Once the patient has completed the blood thinner regimen, then take a Baby 81 mg Aspirin daily   for three more weeks. ° ° °Information on my medicine - XARELTO® (Rivaroxaban) ° ° °Why was Xarelto® prescribed for you? °Xarelto® was prescribed for you to reduce the risk of blood clots forming after orthopedic surgery. The medical term for these abnormal blood clots is venous thromboembolism (VTE). ° °What do you need to know about xarelto® ? °Take your Xarelto® ONCE DAILY at the same time every day. °You may take it either with or without food. ° °If you have difficulty swallowing the tablet whole, you may crush it and mix in applesauce just prior to taking your dose. ° °Take Xarelto® exactly as prescribed by your doctor and DO NOT stop taking Xarelto® without talking to the doctor who prescribed the medication.  Stopping without other VTE prevention medication to take the place of Xarelto® may increase your risk of developing a clot. ° °After discharge, you should have regular check-up appointments with your healthcare provider that is prescribing your Xarelto®.   ° °What do you do if you miss a dose? °If you miss a dose, take it as soon as you remember on the same day then continue your regularly scheduled once daily regimen the next day. Do not take two doses of Xarelto® on the same day.  ° °Important Safety Information °A possible side effect of Xarelto® is bleeding. You should call your healthcare provider right away if you experience any of the following: °? Bleeding from an injury or your nose that does not stop. °? Unusual colored urine (red or dark brown) or unusual colored stools (red or black). °? Unusual bruising for unknown reasons. °? A serious  fall or if you hit your head (even if there is no bleeding). ° °Some medicines may interact with Xarelto® and might increase your risk of bleeding while on Xarelto®. To help avoid this, consult your healthcare provider or pharmacist prior to using any new prescription or non-prescription medications, including herbals, vitamins, non-steroidal anti-inflammatory drugs (NSAIDs) and supplements. ° °This website has more information on Xarelto®: www.xarelto.com. ° ° °

## 2017-01-11 NOTE — Progress Notes (Signed)
Assisted Dr. Rose with left, ultrasound guided, adductor canal block. Side rails up, monitors on throughout procedure. See vital signs in flow sheet. Tolerated Procedure well.  

## 2017-01-11 NOTE — H&P (View-Only) (Signed)
Harry Olson DOB: March 19, 1951 Separated / Language: Cleophus Molt / Race: White Male Date of Admission:  01/11/2017 CC:  Left knee pain History of Present Illness The patient is a 66 year old male who comes in for a preoperative History and Physical. The patient is scheduled for a total knee arthroplasty to be performed by Dr. Dione Plover. Aluisio, MD at [facility] on 01-11-2017. The patient was seen for a second opinion. The patient reported left knee symptoms including: pain, swelling, instability, giving way and weakness which began year(s) ago without any known injury. The patient describes their pain as sharp, dull, aching and throbbing. The patient feels that the symptoms are worsening. Prior to being seen, the patient was previously evaluated by a colleague [place], [facility]). Previous work-up for this problem has included knee x-rays. Past treatment for this problem has included intra-articular injection of corticosteroids and viscosupplementation. Current treatment includes nonsteroidal anti-inflammatory drugs (Aleve/Ibuprofen). The left knee has been a problem for several years and has been more progressive over the past 6-12 months. He was been followed and treated by his physician in [place], [place] and had received a couple of cortisone injections that helped and was in the process and received two out of a three shot gel series prior to moving to [place]. He was referred to Dr. Wynelle Link by several people.  He describes the pain as sharp at times with a more constant dull ache. He has had to modify his activities over the past two years and has had to modify his workouts. 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 He has had swelling, popping, grinding, but no buckling or giving way. He describes significant stiffness and unable to kneel down. He has now reached a point where he would like to proceed with surgery for the knee. They have been treated  conservatively in the past for the above stated problem and despite conservative measures, they continue to have progressive pain and severe functional limitations and dysfunction. They have failed non-operative management including home exercise, medications, and injections. It is felt that they would benefit from undergoing total joint replacement. Risks and benefits of the procedure have been discussed with the patient and they elect to proceed with surgery. There are no active contraindications to surgery such as ongoing infection or rapidly progressive neurological disease.  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 age 32, smoker, emphysema Mother  Deceased. age 37, Brain Tumor  Social History Post-Surgical Plans  Home With Caregiver, Home with HHPT. Children  2  Medication History Atorvastatin Calcium (40MG  Tablet, Oral) Active. HydrOXYzine Pamoate (25MG  Capsule, Oral) Active. Meloxicam (15MG  Tablet, Oral) Active. Sertraline HCl (100MG  Tablet, Oral) Active. Sildenafil Citrate (20MG  Tablet, Oral) Active. TraZODone HCl (100MG  Tablet, Oral) Active.    Review of Systems  General Not Present- Chills, Fatigue, Fever, Memory Loss, Night Sweats, Weight Gain and Weight Loss. Skin Not Present- Eczema, Hives, Itching, Lesions and Rash. HEENT Not Present- Dentures, Double Vision, Headache, Hearing Loss, Tinnitus and Visual Loss. Respiratory Present- Cough (recent cough but resolved). Not Present- Allergies, Chronic Cough, Coughing up blood, Shortness of breath at rest and Shortness of breath with exertion. Cardiovascular Not Present- Chest Pain, Difficulty Breathing Lying Down, Murmur, Palpitations, Racing/skipping heartbeats and Swelling. Gastrointestinal Not Present- Abdominal Pain,  Bloody Stool, Constipation,  Diarrhea, Difficulty Swallowing, Heartburn, Jaundice, Loss of appetitie, Nausea and Vomiting. Male Genitourinary Not Present- Blood in Urine, Discharge, Flank Pain, Incontinence, Painful Urination, Urgency, Urinary frequency, Urinary Retention, Urinating at Night and Weak urinary stream. Musculoskeletal Present- Joint Pain. Not Present- Back Pain, Joint Swelling, Morning Stiffness, Muscle Pain, Muscle Weakness and Spasms. Neurological Not Present- Blackout spells, Difficulty with balance, Dizziness, Paralysis, Tremor and Weakness. Psychiatric Not Present- Insomnia.  Vitals Weight: 196 lb Height: 70in Weight was reported by patient. Height was reported by patient. Body Surface Area: 2.07 m Body Mass Index: 28.12 kg/m  Pulse: 68 (Regular)  BP: 118/68 (Sitting, Right Arm, Standard)   Physical Exam General Mental Status -Alert, cooperative and good historian. General Appearance-pleasant, Not in acute distress. Orientation-Oriented X3. Build & Nutrition-Well nourished and Well developed.  Head and Neck Head-normocephalic, atraumatic . Neck Global Assessment - supple, no bruit auscultated on the right, no bruit auscultated on the left.  Eye Pupil - Bilateral-Regular and Round. Motion - Bilateral-EOMI.  ENMT Note: upper and lower dentures   Chest and Lung Exam Auscultation Breath sounds - clear at anterior chest wall and clear at posterior chest wall. Adventitious sounds - No Adventitious sounds.  Cardiovascular Auscultation Rhythm - Regular rate and rhythm. Heart Sounds - S1 WNL and S2 WNL. Murmurs & Other Heart Sounds - Auscultation of the heart reveals - No Murmurs.  Abdomen Palpation/Percussion Tenderness - Abdomen is non-tender to palpation. Rigidity (guarding) - Abdomen is soft. Auscultation Auscultation of the abdomen reveals - Bowel sounds normal.  Male Genitourinary Note: Not done, not pertinent to present  illness   Musculoskeletal Note: Well-developed male alert and oriented in no apparent distress. Both hips have normal range of motion. He has significant tenderness over the greater trochanter on both sides.Right knee shows no effusion. Range of motion is 0-125. There is no crepitus on range of motion of the knee. There is no medial or lateral joint line tenderness. There is no instability noted. His LEFT knee shows no effusion. His range is 5-125. There is moderate crepitus on range of motion of the knee. He is tender medial and lateral with no instability noted. He has an antalgic gait pattern on the LEFT.  Radiographs AP and lateral of the knees show RIGHT knee is normal. LEFT knee has bone-on-bone change in the lateral and patellofemoral compartments. AP pelvis and lateral hip show no arthritis acute or chronic bony abnormalities   Assessment & Plan Primary osteoarthritis of left knee (M17.12)  Note:Surgical Plans: Left Total Knee Replacement  Disposition: Home with home, HHPT  PCP: Teodoro Kil, PA-C - Patient has been seen preoperatively and felt to be stable for surgery.  IV TXA  Anesthesia Issues: None  Patient was instructed on what medications to stop prior to surgery.  Signed electronically by Joelene Millin, III PA-C

## 2017-01-11 NOTE — Anesthesia Procedure Notes (Signed)
Spinal  Patient location during procedure: OR Start time: 01/11/2017 12:50 PM End time: 01/11/2017 1:00 PM Staffing Performed: anesthesiologist  Preanesthetic Checklist Completed: patient identified, site marked, surgical consent, pre-op evaluation, timeout performed, IV checked, risks and benefits discussed and monitors and equipment checked Spinal Block Patient position: sitting Prep: Betadine Patient monitoring: heart rate, continuous pulse ox and blood pressure Injection technique: single-shot Needle Needle type: Sprotte  Needle gauge: 24 G Needle length: 9 cm Additional Notes Expiration date of kit checked and confirmed. Patient tolerated procedure well, without complications.

## 2017-01-11 NOTE — Op Note (Signed)
OPERATIVE REPORT-TOTAL KNEE ARTHROPLASTY   Pre-operative diagnosis- Osteoarthritis  Left knee(s)  Post-operative diagnosis- Osteoarthritis Left knee(s)  Procedure-  Left  Total Knee Arthroplasty  Surgeon- Dione Plover. Ahmere Hemenway, MD  Assistant- Arlee Muslim, PA-C   Anesthesia-  Adductor canal block and spinal  EBL-* No blood loss amount entered *   Drains Hemovac  Tourniquet time-  Total Tourniquet Time Documented: Thigh (Left) - 36 minutes Total: Thigh (Left) - 36 minutes     Complications- None  Condition-PACU - hemodynamically stable.   Brief Clinical Note   Harry Olson is a 66 y.o. year old male with end stage OA of his left knee with progressively worsening pain and dysfunction. He has constant pain, with activity and at rest and significant functional deficits with difficulties even with ADLs. He has had extensive non-op management including analgesics, injections of cortisone, and home exercise program, but remains in significant pain with significant dysfunction. Radiographs show bone on bone arthritis lateral and patellofemoral. He presents now for left Total Knee Arthroplasty.     Procedure in detail---   The patient is brought into the operating room and positioned supine on the operating table. After successful administration of  Adductor canal block and spinal,   a tourniquet is placed high on the  Left thigh(s) and the lower extremity is prepped and draped in the usual sterile fashion. Time out is performed by the operating team and then the  Left lower extremity is wrapped in Esmarch, knee flexed and the tourniquet inflated to 300 mmHg.       A midline incision is made with a ten blade through the subcutaneous tissue to the level of the extensor mechanism. A fresh blade is used to make a medial parapatellar arthrotomy. Soft tissue over the proximal medial tibia is subperiosteally elevated to the joint line with a knife and into the semimembranosus bursa with a Cobb  elevator. Soft tissue over the proximal lateral tibia is elevated with attention being paid to avoiding the patellar tendon on the tibial tubercle. The patella is everted, knee flexed 90 degrees and the ACL and PCL are removed. Findings are bone on bone lateral and patellofemoral with large global osteophytes.        The drill is used to create a starting hole in the distal femur and the canal is thoroughly irrigated with sterile saline to remove the fatty contents. The 5 degree Left  valgus alignment guide is placed into the femoral canal and the distal femoral cutting block is pinned to remove 10 mm off the distal femur. Resection is made with an oscillating saw.      The tibia is subluxed forward and the menisci are removed. The extramedullary alignment guide is placed referencing proximally at the medial aspect of the tibial tubercle and distally along the second metatarsal axis and tibial crest. The block is pinned to remove 77mm off the more deficient lateral  side. Resection is made with an oscillating saw. Size 7is the most appropriate size for the tibia and the proximal tibia is prepared with the modular drill and keel punch for that size.      The femoral sizing guide is placed and size 6 is most appropriate. Rotation is marked off the epicondylar axis and confirmed by creating a rectangular flexion gap at 90 degrees. The size 6 cutting block is pinned in this rotation and the anterior, posterior and chamfer cuts are made with the oscillating saw. The intercondylar block is then placed and  that cut is made.      Trial size 7 tibial component, trial size 6 posterior stabilized femur and a 10  mm posterior stabilized rotating platform insert trial is placed. Full extension is achieved with excellent varus/valgus and anterior/posterior balance throughout full range of motion. The patella is everted and thickness measured to be 24  mm. Free hand resection is taken to 14 mm, a 38 template is placed, lug  holes are drilled, trial patella is placed, and it tracks normally. Osteophytes are removed off the posterior femur with the trial in place. All trials are removed and the cut bone surfaces prepared with pulsatile lavage. Cement is mixed and once ready for implantation, the size 7 tibial implant, size  6 posterior stabilized femoral component, and the size 38 patella are cemented in place and the patella is held with the clamp. The trial insert is placed and the knee held in full extension. The Exparel (20 ml mixed with 60 ml saline) is injected into the extensor mechanism, posterior capsule, medial and lateral gutters and subcutaneous tissues.  All extruded cement is removed and once the cement is hard the permanent 10 mm posterior stabilized rotating platform insert is placed into the tibial tray.      The wound is copiously irrigated with saline solution and the extensor mechanism closed over a hemovac drain with #1 V-loc suture. The tourniquet is released for a total tourniquet time of 35  minutes. Flexion against gravity is 140 degrees and the patella tracks normally. Subcutaneous tissue is closed with 2.0 vicryl and subcuticular with running 4.0 Monocryl. The incision is cleaned and dried and steri-strips and a bulky sterile dressing are applied. The limb is placed into a knee immobilizer and the patient is awakened and transported to recovery in stable condition.      Please note that a surgical assistant was a medical necessity for this procedure in order to perform it in a safe and expeditious manner. Surgical assistant was necessary to retract the ligaments and vital neurovascular structures to prevent injury to them and also necessary for proper positioning of the limb to allow for anatomic placement of the prosthesis.   Dione Plover Cleofas Hudgins, MD    01/11/2017, 1:53 PM

## 2017-01-11 NOTE — Anesthesia Preprocedure Evaluation (Signed)
Anesthesia Evaluation  Patient identified by MRN, date of birth, ID band Patient awake    Reviewed: Allergy & Precautions, NPO status , Patient's Chart, lab work & pertinent test results  Airway Mallampati: II  TM Distance: >3 FB Neck ROM: Full    Dental no notable dental hx.    Pulmonary neg pulmonary ROS,    Pulmonary exam normal breath sounds clear to auscultation       Cardiovascular hypertension, Normal cardiovascular exam Rhythm:Regular Rate:Normal     Neuro/Psych negative neurological ROS  negative psych ROS   GI/Hepatic negative GI ROS, Neg liver ROS,   Endo/Other  negative endocrine ROS  Renal/GU negative Renal ROS  negative genitourinary   Musculoskeletal negative musculoskeletal ROS (+)   Abdominal   Peds negative pediatric ROS (+)  Hematology negative hematology ROS (+)   Anesthesia Other Findings   Reproductive/Obstetrics negative OB ROS                             Anesthesia Physical Anesthesia Plan  ASA: II  Anesthesia Plan: Spinal   Post-op Pain Management:    Induction: Intravenous  PONV Risk Score and Plan:   Airway Management Planned: Simple Face Mask  Additional Equipment:   Intra-op Plan:   Post-operative Plan:   Informed Consent: I have reviewed the patients History and Physical, chart, labs and discussed the procedure including the risks, benefits and alternatives for the proposed anesthesia with the patient or authorized representative who has indicated his/her understanding and acceptance.   Dental advisory given  Plan Discussed with: CRNA and Surgeon  Anesthesia Plan Comments:         Anesthesia Quick Evaluation

## 2017-01-11 NOTE — Transfer of Care (Signed)
Immediate Anesthesia Transfer of Care Note  Patient: Harry Olson  Procedure(s) Performed: Procedure(s): LEFT TOTAL LEFT KNEE ARTHROPLASTY (Left)  Patient Location: PACU  Anesthesia Type:Spinal  Level of Consciousness:  sedated, patient cooperative and responds to stimulation  Airway & Oxygen Therapy:Patient Spontanous Breathing and Patient connected to face mask oxgen  Post-op Assessment:  Report given to PACU RN and Post -op Vital signs reviewed and stable  Post vital signs:  Reviewed and stable  Last Vitals:  Vitals:   01/11/17 1227 01/11/17 1228  BP:  138/79  Pulse: 63   Resp: (!) 21   Temp:    SpO2: 58%     Complications: No apparent anesthesia complications

## 2017-01-11 NOTE — Interval H&P Note (Signed)
History and Physical Interval Note:  01/11/2017 10:39 AM  Harry Olson  has presented today for surgery, with the diagnosis of Osteoarthritis Left Knee  The various methods of treatment have been discussed with the patient and family. After consideration of risks, benefits and other options for treatment, the patient has consented to  Procedure(s): LEFT TOTAL LEFT KNEE ARTHROPLASTY (Left) as a surgical intervention .  The patient's history has been reviewed, patient examined, no change in status, stable for surgery.  I have reviewed the patient's chart and labs.  Questions were answered to the patient's satisfaction.     [name]

## 2017-01-11 NOTE — Anesthesia Procedure Notes (Signed)
Anesthesia Regional Block: Adductor canal block   Pre-Anesthetic Checklist: ,, timeout performed, Correct Patient, Correct Site, Correct Laterality, Correct Procedure, Correct Position, site marked, Risks and benefits discussed,  Surgical consent,  Pre-op evaluation,  At surgeon's request and post-op pain management  Laterality: Left  Prep: chloraprep       Needles:  Injection technique: Single-shot  Needle Type: Echogenic Needle     Needle Length: 9cm      Additional Needles:   Procedures:,,,, ultrasound used (permanent image in chart),,,,  Narrative:  Start time: 01/11/2017 12:10 PM End time: 01/11/2017 12:20 PM Injection made incrementally with aspirations every 5 mL.  Performed by: Personally  Anesthesiologist: Paulette Rockford  Additional Notes: Patient tolerated the procedure well without complications

## 2017-01-12 LAB — CBC
HEMATOCRIT: 36.2 % — AB (ref 39.0–52.0)
HEMOGLOBIN: 12.2 g/dL — AB (ref 13.0–17.0)
MCH: 29.7 pg (ref 26.0–34.0)
MCHC: 33.7 g/dL (ref 30.0–36.0)
MCV: 88.1 fL (ref 78.0–100.0)
Platelets: 138 10*3/uL — ABNORMAL LOW (ref 150–400)
RBC: 4.11 MIL/uL — AB (ref 4.22–5.81)
RDW: 12.7 % (ref 11.5–15.5)
WBC: 11.1 10*3/uL — ABNORMAL HIGH (ref 4.0–10.5)

## 2017-01-12 LAB — BASIC METABOLIC PANEL
Anion gap: 9 (ref 5–15)
BUN: 14 mg/dL (ref 6–20)
CHLORIDE: 106 mmol/L (ref 101–111)
CO2: 25 mmol/L (ref 22–32)
CREATININE: 1.19 mg/dL (ref 0.61–1.24)
Calcium: 8.9 mg/dL (ref 8.9–10.3)
GFR calc Af Amer: >60 mL/min (ref >60)
GFR calc non Af Amer: >60 mL/min (ref >60)
Glucose, Bld: 141 mg/dL — ABNORMAL HIGH (ref 65–99)
POTASSIUM: 3.9 mmol/L (ref 3.5–5.1)
SODIUM: 140 mmol/L (ref 135–145)

## 2017-01-12 MED ORDER — RIVAROXABAN 10 MG PO TABS: 10.0000 mg | ORAL_TABLET | Freq: Every day | ORAL | 0 refills | Status: DC

## 2017-01-12 MED ORDER — OXYCODONE HCL 5 MG PO TABS: 5.0000 mg | ORAL_TABLET | ORAL | 0 refills | Status: DC | PRN

## 2017-01-12 MED ORDER — METHOCARBAMOL 500 MG PO TABS: 500.0000 mg | ORAL_TABLET | Freq: Four times a day (QID) | ORAL | 0 refills | Status: DC | PRN

## 2017-01-12 NOTE — Progress Notes (Signed)
Discharge planning, spoke with patient and spouse at bedside. [facility]. [facility] for referral. Has RW, declines 3n1. [phone]

## 2017-01-12 NOTE — Progress Notes (Signed)
   Subjective: 1 Day Post-Op Procedure(s) (LRB): LEFT TOTAL LEFT KNEE ARTHROPLASTY (Left) Patient reports pain as mild.   Patient seen in rounds for Dr. Wynelle Link.  Wife at bedside on morning rounds. Patient is well, but has had some minor complaints of pain in the knee, requiring pain medications We will start therapy today.  Plan is to go Home after hospital stay.  Objective: Vital signs in last 24 hours: Temp:  [97.5 F (36.4 C)-98.4 F (36.9 C)] 97.8 F (36.6 C) (10/16 1344) Pulse Rate:  [51-92] 74 (10/16 1344) Resp:  [16-18] 16 (10/16 1344) BP: (119-139)/(56-75) 134/73 (10/16 1344) SpO2:  [94 %-98 %] 97 % (10/16 1344)  Intake/Output from previous day:  Intake/Output Summary (Last 24 hours) at 01/12/17 2054 Last data filed at 01/12/17 2037  Gross per 24 hour  Intake          2996.67 ml  Output             3025 ml  Net           -28.33 ml    Intake/Output this shift: Total I/O In: -  Out: 200 [Urine:200]  Labs:  Recent Labs  01/12/17 0606  HGB 12.2*    Recent Labs  01/12/17 0606  WBC 11.1*  RBC 4.11*  HCT 36.2*  PLT 138*    Recent Labs  01/12/17 0606  NA 140  K 3.9  CL 106  CO2 25  BUN 14  CREATININE 1.19  GLUCOSE 141*  CALCIUM 8.9   No results for input(s): LABPT, INR in the last 72 hours.  EXAM General - Patient is Alert, Appropriate and Oriented Extremity - Neurovascular intact Sensation intact distally Dressing - dressing C/D/I Motor Function - intact, moving foot and toes well on exam.  Hemovac pulled without difficulty.  Past Medical History:  Diagnosis Date  . Anxiety   . Arthritis   . BPH (benign prostatic hyperplasia)   . Cancer ([place])    hx of melanoma excised 15 years ago   . CVA (cerebral vascular accident) ([place]) 2013   small amount of short term memory issues   . Depression   . ED (erectile dysfunction)   . GERD (gastroesophageal reflux disease)    hx of   . Gout   . History of kidney stones   . Hyperlipidemia   .  Hypertension    off of meds greater than a year - patient weaned himself off of meds   . Pneumonia    hx of walking pneumonia     Assessment/Plan: 1 Day Post-Op Procedure(s) (LRB): LEFT TOTAL LEFT KNEE ARTHROPLASTY (Left) Principal Problem:   OA (osteoarthritis) of knee  Estimated body mass index is 27.98 kg/m as calculated from the following:   Height as of this encounter: 5\' 10"  (1.778 m).   Weight as of this encounter: 88.5 kg (195 lb). Advance diet Up with therapy Plan for discharge tomorrow  DVT Prophylaxis - Xarelto Weight-Bearing as tolerated to left leg D/C O2 and Pulse OX and try on Room Air  Arlee Muslim, PA-C Orthopaedic Surgery 01/12/2017, 8:54 PM

## 2017-01-12 NOTE — Evaluation (Signed)
Occupational Therapy Evaluation Patient Details Name: Harry Olson MRN: [id number] DOB: [birth year] Today's Date: 01/12/2017    History of Present Illness 66 yo male s/p L TKA 01/11/17   Clinical Impression   Pt was admitted for the above sx. All educations was completed. No further OT is needed at this time    Follow Up Recommendations  Supervision/Assistance - 24 hour    Equipment Recommendations  None recommended by OT    Recommendations for Other Services       Precautions / Restrictions Precautions Precautions: Fall;Knee Required Braces or Orthoses: Knee Immobilizer - Left Restrictions Weight Bearing Restrictions: No LLE Weight Bearing: Weight bearing as tolerated      Mobility Bed Mobility Overal bed mobility: Needs Assistance Bed Mobility: Supine to Sit     Supine to sit: Supervision;HOB elevated     General bed mobility comments: oob by PT  Transfers Overall transfer level: Needs assistance Equipment used: Rolling walker (2 wheeled) Transfers: Sit to/from Stand Sit to Stand: Min guard         General transfer comment: for safety    Balance                                           ADL either performed or assessed with clinical judgement   ADL Overall ADL's : Needs assistance/impaired Eating/Feeding: Independent   Grooming: Supervision/safety;Standing   Upper Body Bathing: Set up;Sitting   Lower Body Bathing: Minimal assistance;Sit to/from stand   Upper Body Dressing : Set up;Sitting   Lower Body Dressing: Minimal assistance;Sit to/from stand   Toilet Transfer: Min guard;Ambulation;BSC;RW   Toileting- Water quality scientist and Hygiene: Min guard;Sit to/from stand   Tub/ Shower Transfer: Walk-in shower;Min guard;Ambulation;Shower seat     General ADL Comments: pt will have 24/7 x 2 weeks. Will need help with L shoe and sock.  Practiced bathroom transfers and reviewed precautions and walker safety     Vision          Perception     Praxis      Pertinent Vitals/Pain Pain Assessment: 0-10 Pain Score: 5  Pain Location: L knee Pain Descriptors / Indicators: Sore;Aching Pain Intervention(s): Limited activity within patient's tolerance;Monitored during session;Premedicated before session;Repositioned;Ice applied     Hand Dominance     Extremity/Trunk Assessment Upper Extremity Assessment Upper Extremity Assessment: Overall WFL for tasks assessed      Cervical / Trunk Assessment Cervical / Trunk Assessment: Normal   Communication Communication Communication: No difficulties   Cognition Arousal/Alertness: Awake/alert Behavior During Therapy: WFL for tasks assessed/performed Overall Cognitive Status: Within Functional Limits for tasks assessed                                     General Comments       Exercises    Shoulder Instructions      Home Living Family/patient expects to be discharged to:: Private residence Living Arrangements: Spouse/significant other Available Help at Discharge: Family Type of Home:  (townhome) Home Access: Stairs to enter Technical brewer of Steps: 1 small threshold   Home Layout: Able to live on main level with bedroom/bathroom;Two level     Bathroom Shower/Tub: Occupational psychologist: Handicapped height     Home Equipment: Environmental consultant - 2 wheels;Shower seat   Additional Comments:  vanity next to commode      Prior Functioning/Environment Level of Independence: Independent                 OT Problem List:        OT Treatment/Interventions:      OT Goals(Current goals can be found in the care plan section) Acute Rehab OT Goals Patient Stated Goal: get back to working out OT Goal Formulation: All assessment and education complete, DC therapy  OT Frequency:     Barriers to D/C:            Co-evaluation              AM-PAC PT "6 Clicks" Daily Activity     Outcome Measure Help from  another person eating meals?: None Help from another person taking care of personal grooming?: A Little Help from another person toileting, which includes using toliet, bedpan, or urinal?: A Little Help from another person bathing (including washing, rinsing, drying)?: A Little Help from another person to put on and taking off regular upper body clothing?: A Little Help from another person to put on and taking off regular lower body clothing?: A Little 6 Click Score: 19   End of Session    Activity Tolerance: Patient tolerated treatment well Patient left: in chair;with call bell/phone within reach;with family/visitor present  OT Visit Diagnosis: Pain Pain - Right/Left: Left Pain - part of body: Knee                Time: 3664-4034 OT Time Calculation (min): 12 min Charges:  OT General Charges $OT Visit: 1 Visit OT Evaluation $OT Eval Low Complexity: 1 Low G-Codes:     [place], OTR/L [phone] 01/12/2017  Ran Tullis 01/12/2017, 11:13 AM

## 2017-01-12 NOTE — Evaluation (Addendum)
Physical Therapy Evaluation Patient Details Name: Harry Olson MRN: [id number] DOB: 09/09/1950 Today's Date: 01/12/2017   History of Present Illness  66 yo male s/p L TKA 01/11/17  Clinical Impression  On eval, pt required Min guard assist for mobility. He walked ~115 feet with a RW. Pain rated 5/10. Will follow and progress activity as tolerated. Plan is for home + HHPT. Instructed pt to work on quad sets-10 reps every hour as tolerated.    Follow Up Recommendations DC plan and follow up therapy as arranged by surgeon (HHPT)    Equipment Recommendations  None recommended by PT    Recommendations for Other Services       Precautions / Restrictions Precautions Precautions: Fall;Knee Restrictions Weight Bearing Restrictions: No LLE Weight Bearing: Weight bearing as tolerated      Mobility  Bed Mobility Overal bed mobility: Needs Assistance Bed Mobility: Supine to Sit     Supine to sit: Supervision;HOB elevated     General bed mobility comments: for safety  Transfers Overall transfer level: Needs assistance Equipment used: Rolling walker (2 wheeled) Transfers: Sit to/from Stand Sit to Stand: Min guard         General transfer comment: close guard for safety. VCs hand/LE placement  Ambulation/Gait Ambulation/Gait assistance: Min guard Ambulation Distance (Feet): 115 Feet Assistive device: Rolling walker (2 wheeled) Gait Pattern/deviations: Step-to pattern     General Gait Details: close guard for safety. VCs safety, sequence.   Stairs            Wheelchair Mobility    Modified Rankin (Stroke Patients Only)       Balance                                             Pertinent Vitals/Pain Pain Assessment: 0-10 Pain Score: 5  Pain Location: L knee Pain Descriptors / Indicators: Sore;Aching Pain Intervention(s): Monitored during session;Repositioned;Ice applied    Home Living Family/patient expects to be discharged to::  Private residence Living Arrangements: Spouse/significant other Available Help at Discharge: Family Type of Home:  (townhome) Home Access: Stairs to enter   Technical brewer of Steps: 1 small threshold Home Layout: Able to live on main level with bedroom/bathroom;Two level Home Equipment: Walker - 2 wheels;Shower seat      Prior Function Level of Independence: Independent               Hand Dominance        Extremity/Trunk Assessment   Upper Extremity Assessment Upper Extremity Assessment: Defer to OT evaluation    Lower Extremity Assessment Lower Extremity Assessment: Generalized weakness (s/p L TKA)    Cervical / Trunk Assessment Cervical / Trunk Assessment: Normal  Communication   Communication: No difficulties  Cognition Arousal/Alertness: Awake/alert Behavior During Therapy: WFL for tasks assessed/performed Overall Cognitive Status: Within Functional Limits for tasks assessed                                        General Comments      Exercises Total Joint Exercises Ankle Circles/Pumps: AROM;Both;10 reps;Supine Quad Sets: AROM;Both;10 reps;Supine Heel Slides: AAROM;Left;10 reps;Supine Hip ABduction/ADduction: Left;AROM;10 reps;Supine Straight Leg Raises: AROM;Left;10 reps;Supine Goniometric ROM: ~15-85 degrees   Assessment/Plan    PT Assessment Patient needs continued PT services  PT Problem  List Decreased strength;Decreased mobility;Decreased range of motion;Decreased activity tolerance;Decreased balance;Decreased knowledge of use of DME;Pain;Decreased knowledge of precautions       PT Treatment Interventions DME instruction;Gait training;Therapeutic activities;Therapeutic exercise;Patient/family education;Balance training;Functional mobility training    PT Goals (Current goals can be found in the Care Plan section)  Acute Rehab PT Goals Patient Stated Goal: get back to working out PT Goal Formulation: With patient Time  For Goal Achievement: 01/26/17 Potential to Achieve Goals: Good    Frequency 7X/week   Barriers to discharge        Co-evaluation               AM-PAC PT "6 Clicks" Daily Activity  Outcome Measure Difficulty turning over in bed (including adjusting bedclothes, sheets and blankets)?: None Difficulty moving from lying on back to sitting on the side of the bed? : None Difficulty sitting down on and standing up from a chair with arms (e.g., wheelchair, bedside commode, etc,.)?: A Little Help needed moving to and from a bed to chair (including a wheelchair)?: A Little Help needed walking in hospital room?: A Little Help needed climbing 3-5 steps with a railing? : A Little 6 Click Score: 20    End of Session Equipment Utilized During Treatment: Gait belt;Left knee immobilizer Activity Tolerance: Patient tolerated treatment well Patient left: in chair;with call bell/phone within reach;with family/visitor present   PT Visit Diagnosis: Muscle weakness (generalized) (M62.81);Difficulty in walking, not elsewhere classified (R26.2)    Time: 7290-2111 PT Time Calculation (min) (ACUTE ONLY): 14 min   Charges:   PT Evaluation $PT Eval Low Complexity: 1 Low     PT G Codes:          Weston Anna, MPT Pager: [phone]

## 2017-01-12 NOTE — Anesthesia Postprocedure Evaluation (Signed)
Anesthesia Post Note  Patient: Harry Olson  Procedure(s) Performed: LEFT TOTAL LEFT KNEE ARTHROPLASTY (Left Knee)     Patient location during evaluation: PACU Anesthesia Type: Spinal Level of consciousness: oriented and awake and alert Pain management: pain level controlled Vital Signs Assessment: post-procedure vital signs reviewed and stable Respiratory status: spontaneous breathing, respiratory function stable and patient connected to nasal cannula oxygen Cardiovascular status: blood pressure returned to baseline and stable Postop Assessment: no headache, no backache and no apparent nausea or vomiting Anesthetic complications: no    Last Vitals:  Vitals:   01/12/17 1045 01/12/17 1344  BP: (!) 119/56 134/73  Pulse: (!) 56 74  Resp: 18 16  Temp: 36.5 C 36.6 C  SpO2: 97% 97%    Last Pain:  Vitals:   01/12/17 1610  TempSrc:   PainSc: 4                  Christabella Alvira S

## 2017-01-12 NOTE — Discharge Summary (Signed)
Physician Discharge Summary   Patient ID: Harry Olson MRN: [id number] DOB/AGE: May 09, 1950 66 y.o.  Admit date: 01/11/2017 Discharge date: 01/13/2017   Primary Diagnosis:  Osteoarthritis  Left knee(s) Admission Diagnoses:  Past Medical History:  Diagnosis Date  . Anxiety   . Arthritis   . BPH (benign prostatic hyperplasia)   . Cancer ([place])    hx of melanoma excised 15 years ago   . CVA (cerebral vascular accident) ([place]) 2013   small amount of short term memory issues   . Depression   . ED (erectile dysfunction)   . GERD (gastroesophageal reflux disease)    hx of   . Gout   . History of kidney stones   . Hyperlipidemia   . Hypertension    off of meds greater than a year - patient weaned himself off of meds   . Pneumonia    hx of walking pneumonia    Discharge Diagnoses:   Principal Problem:   OA (osteoarthritis) of knee  Estimated body mass index is 27.98 kg/m as calculated from the following:   Height as of this encounter: 5' 10"  (1.778 m).   Weight as of this encounter: 88.5 kg (195 lb).  Procedure:  Procedure(s) (LRB): LEFT TOTAL LEFT KNEE ARTHROPLASTY (Left)   Consults: None  HPI: Harry Olson is a 66 y.o. year old male with end stage OA of his left knee with progressively worsening pain and dysfunction. He has constant pain, with activity and at rest and significant functional deficits with difficulties even with ADLs. He has had extensive non-op management including analgesics, injections of cortisone, and home exercise program, but remains in significant pain with significant dysfunction. Radiographs show bone on bone arthritis lateral and patellofemoral. He presents now for left Total Knee Arthroplasty.   Laboratory Data: Admission on 01/11/2017  Component Date Value Ref Range Status  . WBC 01/12/2017 11.1* 4.0 - 10.5 K/uL Final  . RBC 01/12/2017 4.11* 4.22 - 5.81 MIL/uL Final  . Hemoglobin 01/12/2017 12.2* 13.0 - 17.0 g/dL Final  . HCT 01/12/2017  36.2* 39.0 - 52.0 % Final  . MCV 01/12/2017 88.1  78.0 - 100.0 fL Final  . MCH 01/12/2017 29.7  26.0 - 34.0 pg Final  . MCHC 01/12/2017 33.7  30.0 - 36.0 g/dL Final  . RDW 01/12/2017 12.7  11.5 - 15.5 % Final  . Platelets 01/12/2017 138* 150 - 400 K/uL Final  . Sodium 01/12/2017 140  135 - 145 mmol/L Final  . Potassium 01/12/2017 3.9  3.5 - 5.1 mmol/L Final  . Chloride 01/12/2017 106  101 - 111 mmol/L Final  . CO2 01/12/2017 25  22 - 32 mmol/L Final  . Glucose, Bld 01/12/2017 141* 65 - 99 mg/dL Final  . BUN 01/12/2017 14  6 - 20 mg/dL Final  . Creatinine, Ser 01/12/2017 1.19  0.61 - 1.24 mg/dL Final  . Calcium 01/12/2017 8.9  8.9 - 10.3 mg/dL Final  . GFR calc non Af Amer 01/12/2017 >60  >60 mL/min Final  . GFR calc Af Amer 01/12/2017 >60  >60 mL/min Final   Comment: (NOTE) The eGFR has been calculated using the CKD EPI equation. This calculation has not been validated in all clinical situations. eGFR's persistently <60 mL/min signify possible Chronic Kidney Disease.   . Anion gap 01/12/2017 9  5 - 15 Final  . WBC 01/13/2017 9.4  4.0 - 10.5 K/uL Final  . RBC 01/13/2017 3.63* 4.22 - 5.81 MIL/uL Final  . Hemoglobin 01/13/2017 10.7*  13.0 - 17.0 g/dL Final  . HCT 01/13/2017 32.4* 39.0 - 52.0 % Final  . MCV 01/13/2017 89.3  78.0 - 100.0 fL Final  . MCH 01/13/2017 29.5  26.0 - 34.0 pg Final  . MCHC 01/13/2017 33.0  30.0 - 36.0 g/dL Final  . RDW 01/13/2017 13.1  11.5 - 15.5 % Final  . Platelets 01/13/2017 135* 150 - 400 K/uL Final  . Sodium 01/13/2017 143  135 - 145 mmol/L Final  . Potassium 01/13/2017 3.8  3.5 - 5.1 mmol/L Final  . Chloride 01/13/2017 107  101 - 111 mmol/L Final  . CO2 01/13/2017 31  22 - 32 mmol/L Final  . Glucose, Bld 01/13/2017 118* 65 - 99 mg/dL Final  . BUN 01/13/2017 15  6 - 20 mg/dL Final  . Creatinine, Ser 01/13/2017 1.13  0.61 - 1.24 mg/dL Final  . Calcium 01/13/2017 8.8* 8.9 - 10.3 mg/dL Final  . GFR calc non Af Amer 01/13/2017 >60  >60 mL/min Final  .  GFR calc Af Amer 01/13/2017 >60  >60 mL/min Final   Comment: (NOTE) The eGFR has been calculated using the CKD EPI equation. This calculation has not been validated in all clinical situations. eGFR's persistently <60 mL/min signify possible Chronic Kidney Disease.   Georgiann Hahn gap 01/13/2017 5  5 - 15 Final  Hospital Outpatient Visit on 01/05/2017  Component Date Value Ref Range Status  . aPTT 01/05/2017 37* 24 - 36 seconds Final   Comment:        IF BASELINE aPTT IS ELEVATED, SUGGEST PATIENT RISK ASSESSMENT BE USED TO DETERMINE APPROPRIATE ANTICOAGULANT THERAPY.   . WBC 01/05/2017 6.8  4.0 - 10.5 K/uL Final  . RBC 01/05/2017 4.83  4.22 - 5.81 MIL/uL Final  . Hemoglobin 01/05/2017 14.2  13.0 - 17.0 g/dL Final  . HCT 01/05/2017 42.9  39.0 - 52.0 % Final  . MCV 01/05/2017 88.8  78.0 - 100.0 fL Final  . MCH 01/05/2017 29.4  26.0 - 34.0 pg Final  . MCHC 01/05/2017 33.1  30.0 - 36.0 g/dL Final  . RDW 01/05/2017 13.0  11.5 - 15.5 % Final  . Platelets 01/05/2017 182  150 - 400 K/uL Final  . Sodium 01/05/2017 142  135 - 145 mmol/L Final  . Potassium 01/05/2017 3.9  3.5 - 5.1 mmol/L Final  . Chloride 01/05/2017 106  101 - 111 mmol/L Final  . CO2 01/05/2017 24  22 - 32 mmol/L Final  . Glucose, Bld 01/05/2017 94  65 - 99 mg/dL Final  . BUN 01/05/2017 18  6 - 20 mg/dL Final  . Creatinine, Ser 01/05/2017 1.13  0.61 - 1.24 mg/dL Final  . Calcium 01/05/2017 9.5  8.9 - 10.3 mg/dL Final  . Total Protein 01/05/2017 7.0  6.5 - 8.1 g/dL Final  . Albumin 01/05/2017 4.0  3.5 - 5.0 g/dL Final  . AST 01/05/2017 15  15 - 41 U/L Final  . ALT 01/05/2017 14* 17 - 63 U/L Final  . Alkaline Phosphatase 01/05/2017 54  38 - 126 U/L Final  . Total Bilirubin 01/05/2017 1.3* 0.3 - 1.2 mg/dL Final  . GFR calc non Af Amer 01/05/2017 >60  >60 mL/min Final  . GFR calc Af Amer 01/05/2017 >60  >60 mL/min Final   Comment: (NOTE) The eGFR has been calculated using the CKD EPI equation. This calculation has not been  validated in all clinical situations. eGFR's persistently <60 mL/min signify possible Chronic Kidney Disease.   . Anion gap 01/05/2017 12  5 - 15 Final  . Prothrombin Time 01/05/2017 12.2  11.4 - 15.2 seconds Final  . INR 01/05/2017 0.91   Final  . ABO/RH(D) 01/05/2017 O POS   Final  . Antibody Screen 01/05/2017 NEG   Final  . Sample Expiration 01/05/2017 01/14/2017   Final  . Extend sample reason 01/05/2017 NO TRANSFUSIONS OR PREGNANCY IN THE PAST 3 MONTHS   Final  . MRSA, PCR 01/05/2017 NEGATIVE  NEGATIVE Final  . Staphylococcus aureus 01/05/2017 POSITIVE* NEGATIVE Final   Comment: (NOTE) The Xpert SA Assay (FDA approved for NASAL specimens in patients 4 years of age and older), is one component of a comprehensive surveillance program. It is not intended to diagnose infection nor to guide or monitor treatment.   . ABO/RH(D) 01/05/2017 O POS   Final     X-Rays:No results found.  EKG: Orders placed or performed during the hospital encounter of 01/05/17  . EKG 12 lead  . EKG 12 lead     Hospital Course: Harry H Daye is a 66 y.o. who was admitted to [facility]. They were brought to the operating room on 01/11/2017 and underwent Procedure(s): LEFT TOTAL LEFT KNEE ARTHROPLASTY.  Patient tolerated the procedure well and was later transferred to the recovery room and then to the orthopaedic floor for postoperative care.  They were given PO and IV analgesics for pain control following their surgery.  They were given 24 hours of postoperative antibiotics of  Anti-infectives    Start     Dose/Rate Route Frequency Ordered Stop   01/11/17 1900  ceFAZolin (ANCEF) IVPB 2g/100 mL premix     2 g 200 mL/hr over 30 Minutes Intravenous Every 6 hours 01/11/17 1614 01/12/17 0101   01/11/17 1058  ceFAZolin (ANCEF) IVPB 2g/100 mL premix  Status:  Discontinued     2 g 200 mL/hr over 30 Minutes Intravenous On call to O.R. 01/11/17 1058 01/11/17 1100   01/11/17 1045  ceFAZolin (ANCEF)  2-4 GM/100ML-% IVPB    Comments:  [place] Session   : cabinet override      01/11/17 1045 01/11/17 1245   01/11/17 1036  ceFAZolin (ANCEF) IVPB 2g/100 mL premix     2 g 200 mL/hr over 30 Minutes Intravenous On call to O.R. 01/11/17 1036 01/11/17 1300     and started on DVT prophylaxis in the form of Xarelto.   PT and OT were ordered for total joint protocol.  Discharge planning consulted to help with postop disposition and equipment needs.  Patient had a decent night on the evening of surgery.  They started to get up OOB with therapy on day one. Hemovac drain was pulled without difficulty.  Continued to work with therapy into day two.  Dressing was changed on day two and the incision was healing well.   Patient was seen in rounds on day two and was ready to go home.   Diet: Cardiac diet Activity:WBAT Follow-up:in 2 weeks Disposition - Home Discharged Condition: stable   Discharge Instructions    Call MD / Call 911    Complete by:  As directed    If you experience chest pain or shortness of breath, CALL 911 and be transported to the hospital emergency room.  If you develope a fever above 101 F, pus (white drainage) or increased drainage or redness at the wound, or calf pain, call your surgeon's office.   Change dressing    Complete by:  As directed    Change dressing daily  with sterile 4 x 4 inch gauze dressing and apply TED hose. Do not submerge the incision under water.   Constipation Prevention    Complete by:  As directed    Drink plenty of fluids.  Prune juice may be helpful.  You may use a stool softener, such as Colace (over the counter) 100 mg twice a day.  Use MiraLax (over the counter) for constipation as needed.   Diet general    Complete by:  As directed    Discharge instructions    Complete by:  As directed    Take Xarelto for two and a half more weeks, then discontinue Xarelto.  Pick up stool softner and laxative for home use following surgery while on pain  medications. Do not submerge incision under water. Please use good hand washing techniques while changing dressing each day. May shower starting three days after surgery. Please use a clean towel to pat the incision dry following showers. Continue to use ice for pain and swelling after surgery. Do not use any lotions or creams on the incision until instructed by your surgeon.  Wear both TED hose on both legs during the day every day for three weeks, but may remove the TED hose at night at home.  Postoperative Constipation Protocol  Constipation - defined medically as fewer than three stools per week and severe constipation as less than one stool per week.  One of the most common issues patients have following surgery is constipation.  Even if you have a regular bowel pattern at home, your normal regimen is likely to be disrupted due to multiple reasons following surgery.  Combination of anesthesia, postoperative narcotics, change in appetite and fluid intake all can affect your bowels.  In order to avoid complications following surgery, here are some recommendations in order to help you during your recovery period.  Colace (docusate) - Pick up an over-the-counter form of Colace or another stool softener and take twice a day as long as you are requiring postoperative pain medications.  Take with a full glass of water daily.  If you experience loose stools or diarrhea, hold the colace until you stool forms back up.  If your symptoms do not get better within 1 week or if they get worse, check with your doctor.  Dulcolax (bisacodyl) - Pick up over-the-counter and take as directed by the product packaging as needed to assist with the movement of your bowels.  Take with a full glass of water.  Use this product as needed if not relieved by Colace only.   MiraLax (polyethylene glycol) - Pick up over-the-counter to have on hand.  MiraLax is a solution that will increase the amount of water in your bowels  to assist with bowel movements.  Take as directed and can mix with a glass of water, juice, soda, coffee, or tea.  Take if you go more than two days without a movement. Do not use MiraLax more than once per day. Call your doctor if you are still constipated or irregular after using this medication for 7 days in a row.  If you continue to have problems with postoperative constipation, please contact the office for further assistance and recommendations.  If you experience "the worst abdominal pain ever" or develop nausea or vomiting, please contact the office immediatly for further recommendations for treatment.   Do not put a pillow under the knee. Place it under the heel.    Complete by:  As directed    Do  not sit on low chairs, stoools or toilet seats, as it may be difficult to get up from low surfaces    Complete by:  As directed    Driving restrictions    Complete by:  As directed    No driving until released by the physician.   Increase activity slowly as tolerated    Complete by:  As directed    Lifting restrictions    Complete by:  As directed    No lifting until released by the physician.   Patient may shower    Complete by:  As directed    You may shower without a dressing once there is no drainage.  Do not wash over the wound.  If drainage remains, do not shower until drainage stops.   TED hose    Complete by:  As directed    Use stockings (TED hose) for 3 weeks on both leg(s).  You may remove them at night for sleeping.   Weight bearing as tolerated    Complete by:  As directed    Laterality:  left   Extremity:  Lower     Allergies as of 01/13/2017   No Known Allergies     Medication List    STOP taking these medications   diclofenac sodium 1 % Gel Commonly known as:  VOLTAREN   lubiprostone 8 MCG capsule Commonly known as:  AMITIZA   meloxicam 15 MG tablet Commonly known as:  MOBIC   PROBIOTIC PO   tadalafil 20 MG tablet Commonly known as:  CIALIS      TAKE these medications   atorvastatin 40 MG tablet Commonly known as:  LIPITOR Take 40 mg by mouth every other day.   cyclobenzaprine 10 MG tablet Commonly known as:  FLEXERIL Take 1 tablet (10 mg total) by mouth 3 (three) times daily as needed for muscle spasms.   dicyclomine 10 MG capsule Commonly known as:  BENTYL Take 10 mg by mouth 4 (four) times daily as needed for spasms.   hydrOXYzine 25 MG tablet Commonly known as:  ATARAX/VISTARIL Take 25 mg by mouth 3 (three) times daily as needed for anxiety.   oxyCODONE 5 MG immediate release tablet Commonly known as:  Oxy IR/ROXICODONE Take 1-3 tablets (5-15 mg total) by mouth every 3 (three) hours as needed for moderate pain or severe pain.   polyethylene glycol packet Commonly known as:  MIRALAX / GLYCOLAX Take 17 g by mouth daily as needed for moderate constipation.   rivaroxaban 10 MG Tabs tablet Commonly known as:  XARELTO Take 1 tablet (10 mg total) by mouth daily with breakfast. Take Xarelto for two and a half more weeks following discharge from the hospital, then discontinue Xarelto. Once the patient has completed the blood thinner regimen, then take a Baby 81 mg Aspirin daily for three more weeks.   sertraline 100 MG tablet Commonly known as:  ZOLOFT Take 100 mg by mouth daily.   traZODone 100 MG tablet Commonly known as:  DESYREL Take 100 mg by mouth at bedtime.            Discharge Care Instructions        Start     Ordered   01/12/17 0000  Weight bearing as tolerated    Question Answer Comment  Laterality left   Extremity Lower      01/12/17 2058   01/12/17 0000  Change dressing    Comments:  Change dressing daily with sterile 4 x 4 inch  gauze dressing and apply TED hose. Do not submerge the incision under water.   01/12/17 2058     Follow-up Information    [facility] Follow up.   Specialty:  Darke Why:  physical therapy Contact information: [place] [address] [phone]        Gaynelle Arabian, MD. Schedule an appointment as soon as possible for a visit on 01/26/2017.   Specialty:  Orthopedic Surgery Contact information: [address] [place] [address] [phone]           Signed: Arlee Muslim, PA-C Orthopaedic Surgery 01/13/2017, 7:54 AM

## 2017-01-12 NOTE — Progress Notes (Signed)
Physical Therapy Treatment Patient Details Name: Harry Olson MRN: [id number] DOB: 05-24-1950 Today's Date: 01/12/2017    History of Present Illness 66 yo male s/p L TKA 01/11/17    PT Comments    Progressing with mobility. Increased pain this session.    Follow Up Recommendations  DC plan and follow up therapy as arranged by surgeon (HHPT)     Equipment Recommendations  None recommended by PT    Recommendations for Other Services       Precautions / Restrictions Precautions Precautions: Fall;Knee Precaution Comments: reviewed importance of maintaining knee extension at rest (pt tends to keep knee flexed) and performing quad sets Required Braces or Orthoses: Knee Immobilizer - Left Knee Immobilizer - Left: Discontinue once straight leg raise with < 10 degree lag Restrictions Weight Bearing Restrictions: No LLE Weight Bearing: Weight bearing as tolerated    Mobility  Bed Mobility Overal bed mobility: Needs Assistance Bed Mobility: Supine to Sit;Sit to Supine     Supine to sit: Supervision;HOB elevated Sit to supine: Supervision;HOB elevated   General bed mobility comments: oob by PT  Transfers Overall transfer level: Needs assistance Equipment used: Rolling walker (2 wheeled) Transfers: Sit to/from Stand Sit to Stand: Min guard         General transfer comment: for safety  Ambulation/Gait Ambulation/Gait assistance: Min guard Ambulation Distance (Feet): 115 Feet Assistive device: Rolling walker (2 wheeled) Gait Pattern/deviations: Step-to pattern;Antalgic     General Gait Details: Pt limiting WBing this session due to increased pain. He requested to walk same distance as this am.    Financial trader Rankin (Stroke Patients Only)       Balance                                            Cognition Arousal/Alertness: Awake/alert Behavior During Therapy: WFL for tasks  assessed/performed Overall Cognitive Status: Within Functional Limits for tasks assessed                                        Exercises      General Comments        Pertinent Vitals/Pain Pain Assessment: 0-10 Pain Score: 7  Pain Location: L knee Pain Descriptors / Indicators: Aching;Sore Pain Intervention(s): Monitored during session;Repositioned;Ice applied;Heat applied (heat pack to thigh)    Home Living Family/patient expects to be discharged to:: Private residence Living Arrangements: Spouse/significant other Available Help at Discharge: Family         Home Equipment: Gilford Rile - 2 wheels;Shower seat Additional Comments: vanity next to commode    Prior Function Level of Independence: Independent          PT Goals (current goals can now be found in the care plan section) Acute Rehab PT Goals Patient Stated Goal: get back to working out Progress towards PT goals: Progressing toward goals    Frequency    7X/week      PT Plan Current plan remains appropriate    Co-evaluation              AM-PAC PT "6 Clicks" Daily Activity  Outcome Measure  Difficulty turning over in bed (including adjusting bedclothes, sheets and blankets)?: None Difficulty  moving from lying on back to sitting on the side of the bed? : None Difficulty sitting down on and standing up from a chair with arms (e.g., wheelchair, bedside commode, etc,.)?: A Little Help needed moving to and from a bed to chair (including a wheelchair)?: A Little Help needed walking in hospital room?: A Little Help needed climbing 3-5 steps with a railing? : A Little 6 Click Score: 20    End of Session Equipment Utilized During Treatment: Left knee immobilizer Activity Tolerance: Patient limited by pain Patient left: with bed alarm set;with call bell/phone within reach;with family/visitor present   PT Visit Diagnosis: Muscle weakness (generalized) (M62.81);Difficulty in walking, not  elsewhere classified (R26.2)     Time: 0211-1552 PT Time Calculation (min) (ACUTE ONLY): 15 min  Charges:  $Gait Training: 8-22 mins                    G Codes:          Weston Anna, MPT Pager: [phone]

## 2017-01-13 LAB — CBC
HEMATOCRIT: 32.4 % — AB (ref 39.0–52.0)
HEMOGLOBIN: 10.7 g/dL — AB (ref 13.0–17.0)
MCH: 29.5 pg (ref 26.0–34.0)
MCHC: 33 g/dL (ref 30.0–36.0)
MCV: 89.3 fL (ref 78.0–100.0)
Platelets: 135 10*3/uL — ABNORMAL LOW (ref 150–400)
RBC: 3.63 MIL/uL — AB (ref 4.22–5.81)
RDW: 13.1 % (ref 11.5–15.5)
WBC: 9.4 10*3/uL (ref 4.0–10.5)

## 2017-01-13 LAB — BASIC METABOLIC PANEL
Anion gap: 5 (ref 5–15)
BUN: 15 mg/dL (ref 6–20)
CO2: 31 mmol/L (ref 22–32)
CREATININE: 1.13 mg/dL (ref 0.61–1.24)
Calcium: 8.8 mg/dL — ABNORMAL LOW (ref 8.9–10.3)
Chloride: 107 mmol/L (ref 101–111)
GFR calc Af Amer: >60 mL/min (ref >60)
GFR calc non Af Amer: >60 mL/min (ref >60)
GLUCOSE: 118 mg/dL — AB (ref 65–99)
POTASSIUM: 3.8 mmol/L (ref 3.5–5.1)
Sodium: 143 mmol/L (ref 135–145)

## 2017-01-13 MED ORDER — OXYCODONE HCL 5 MG PO TABS: 5.0000 mg | ORAL_TABLET | ORAL | 0 refills | Status: DC | PRN

## 2017-01-13 MED ORDER — OXYCODONE HCL 5 MG PO TABS
15.0000 mg | ORAL_TABLET | ORAL | Status: DC | PRN
  Administered 2017-01-13 (×4): 15 mg via ORAL
  Filled 2017-01-13 (×4): qty 3

## 2017-01-13 MED ORDER — CYCLOBENZAPRINE HCL 10 MG PO TABS: 10.0000 mg | ORAL_TABLET | Freq: Three times a day (TID) | ORAL | 0 refills | Status: DC | PRN

## 2017-01-13 MED ORDER — RIVAROXABAN 10 MG PO TABS: 10.0000 mg | ORAL_TABLET | Freq: Every day | ORAL | 0 refills | Status: DC

## 2017-01-13 NOTE — Progress Notes (Signed)
Physical Therapy Treatment Patient Details Name: Harry Olson MRN: [id number] DOB: Jun 13, 1950 Today's Date: 01/13/2017    History of Present Illness 66 yo male s/p L TKA 01/11/17    PT Comments    Progressing with mobility. Pain rated 7/10. Reviewed/practiced exercises and gait training. Pt stated he did not have any steps to enter his home. Issued HEP for pt to perform until HHPT begins. All education completed. Ready to d/c from PT standpoint-RN aware.     Follow Up Recommendations  DC plan and follow up therapy as arranged by surgeon (HHPT)     Equipment Recommendations  None recommended by PT    Recommendations for Other Services       Precautions / Restrictions Precautions Precautions: Fall;Knee Precaution Comments: reviewed importance of maintaining knee extension at rest (pt tends to keep knee flexed) and performing quad sets Required Braces or Orthoses: Knee Immobilizer - Left Knee Immobilizer - Left: Discontinue once straight leg raise with < 10 degree lag Restrictions Weight Bearing Restrictions: No LLE Weight Bearing: Weight bearing as tolerated    Mobility  Bed Mobility Overal bed mobility: Needs Assistance Bed Mobility: Supine to Sit     Supine to sit: Supervision;HOB elevated     General bed mobility comments: increased time.   Transfers Overall transfer level: Needs assistance Equipment used: Rolling walker (2 wheeled) Transfers: Sit to/from Stand Sit to Stand: Supervision         General transfer comment: for safety.  Ambulation/Gait Ambulation/Gait assistance: Supervision Ambulation Distance (Feet): 100 Feet Assistive device: Rolling walker (2 wheeled) Gait Pattern/deviations: Step-to pattern;Decreased weight shift to left;Decreased stance time - left     General Gait Details: Pt limiting WBing this session due to increased pain. Cues for pt to try to increase WBing on L LE as distance progressed.    Stairs             Wheelchair Mobility    Modified Rankin (Stroke Patients Only)       Balance                                            Cognition Arousal/Alertness: Awake/alert Behavior During Therapy: WFL for tasks assessed/performed Overall Cognitive Status: Within Functional Limits for tasks assessed                                        Exercises Total Joint Exercises Ankle Circles/Pumps: AROM;Both;10 reps;Supine Quad Sets: AROM;Both;10 reps;Supine Hip ABduction/ADduction: Left;AROM;10 reps;Supine Straight Leg Raises: AROM;Left;10 reps;Supine Knee Flexion: AAROM;Left;10 reps;Supine Goniometric ROM: ~10-80 degrees    General Comments        Pertinent Vitals/Pain Pain Assessment: 0-10 Pain Score: 7  Pain Location: L knee Pain Descriptors / Indicators: Sore;Aching Pain Intervention(s): Monitored during session;Repositioned;Ice applied    Home Living                      Prior Function            PT Goals (current goals can now be found in the care plan section) Progress towards PT goals: Progressing toward goals    Frequency    7X/week      PT Plan Current plan remains appropriate    Co-evaluation  AM-PAC PT "6 Clicks" Daily Activity  Outcome Measure  Difficulty turning over in bed (including adjusting bedclothes, sheets and blankets)?: None Difficulty moving from lying on back to sitting on the side of the bed? : None Difficulty sitting down on and standing up from a chair with arms (e.g., wheelchair, bedside commode, etc,.)?: None Help needed moving to and from a bed to chair (including a wheelchair)?: None Help needed walking in hospital room?: None Help needed climbing 3-5 steps with a railing? : A Little 6 Click Score: 23    End of Session   Activity Tolerance: Patient limited by pain Patient left: in chair;with call bell/phone within reach;with family/visitor present   PT Visit  Diagnosis: Muscle weakness (generalized) (M62.81);Difficulty in walking, not elsewhere classified (R26.2)     Time: 0630-1601 PT Time Calculation (min) (ACUTE ONLY): 22 min  Charges:  $Gait Training: 8-22 mins                    G Codes:         Weston Anna, MPT Pager: [phone]

## 2017-01-13 NOTE — Progress Notes (Signed)
01/13/17 0045 Nursing Arroyo PA called reg patient's c/o pain desoite prescribed meds. Order received to increase oxy to 15 mg q 3 hours. Give first dose now. Will continue to monitor patient

## 2017-01-25 ENCOUNTER — Ambulatory Visit: Payer: Medicare Other | Admitting: Internal Medicine

## 2020-10-13 ENCOUNTER — Encounter (HOSPITAL_COMMUNITY): Payer: Self-pay | Admitting: Emergency Medicine

## 2020-10-13 ENCOUNTER — Other Ambulatory Visit: Payer: Self-pay

## 2020-10-13 ENCOUNTER — Emergency Department (HOSPITAL_COMMUNITY): Payer: Medicare Other

## 2020-10-13 ENCOUNTER — Inpatient Hospital Stay (HOSPITAL_COMMUNITY)
Admission: EM | Admit: 2020-10-13 | Discharge: 2020-10-21 | DRG: 236 | Disposition: A | Discharge status: Home / Self Care | Payer: Medicare Other | Attending: Thoracic Surgery (Cardiothoracic Vascular Surgery) | Admitting: Thoracic Surgery (Cardiothoracic Vascular Surgery)

## 2020-10-13 DIAGNOSIS — Z4682 Encounter for fitting and adjustment of non-vascular catheter: Secondary | ICD-10-CM

## 2020-10-13 DIAGNOSIS — F419 Anxiety disorder, unspecified: Secondary | ICD-10-CM | POA: Diagnosis present

## 2020-10-13 DIAGNOSIS — Z01810 Encounter for preprocedural cardiovascular examination: Secondary | ICD-10-CM | POA: Diagnosis not present

## 2020-10-13 DIAGNOSIS — Z20822 Contact with and (suspected) exposure to covid-19: Secondary | ICD-10-CM | POA: Diagnosis present

## 2020-10-13 DIAGNOSIS — N400 Enlarged prostate without lower urinary tract symptoms: Secondary | ICD-10-CM | POA: Diagnosis present

## 2020-10-13 DIAGNOSIS — E8770 Fluid overload, unspecified: Secondary | ICD-10-CM | POA: Diagnosis present

## 2020-10-13 DIAGNOSIS — Z6829 Body mass index (BMI) 29.0-29.9, adult: Secondary | ICD-10-CM

## 2020-10-13 DIAGNOSIS — E785 Hyperlipidemia, unspecified: Secondary | ICD-10-CM

## 2020-10-13 DIAGNOSIS — F32A Depression, unspecified: Secondary | ICD-10-CM | POA: Diagnosis present

## 2020-10-13 DIAGNOSIS — I493 Ventricular premature depolarization: Secondary | ICD-10-CM | POA: Diagnosis present

## 2020-10-13 DIAGNOSIS — D62 Acute posthemorrhagic anemia: Secondary | ICD-10-CM | POA: Diagnosis not present

## 2020-10-13 DIAGNOSIS — I249 Acute ischemic heart disease, unspecified: Secondary | ICD-10-CM | POA: Diagnosis present

## 2020-10-13 DIAGNOSIS — Z85820 Personal history of malignant melanoma of skin: Secondary | ICD-10-CM | POA: Diagnosis not present

## 2020-10-13 DIAGNOSIS — I209 Angina pectoris, unspecified: Secondary | ICD-10-CM | POA: Diagnosis not present

## 2020-10-13 DIAGNOSIS — M1990 Unspecified osteoarthritis, unspecified site: Secondary | ICD-10-CM | POA: Diagnosis present

## 2020-10-13 DIAGNOSIS — Z8249 Family history of ischemic heart disease and other diseases of the circulatory system: Secondary | ICD-10-CM

## 2020-10-13 DIAGNOSIS — G4700 Insomnia, unspecified: Secondary | ICD-10-CM | POA: Diagnosis present

## 2020-10-13 DIAGNOSIS — I10 Essential (primary) hypertension: Secondary | ICD-10-CM | POA: Diagnosis present

## 2020-10-13 DIAGNOSIS — I69311 Memory deficit following cerebral infarction: Secondary | ICD-10-CM | POA: Diagnosis not present

## 2020-10-13 DIAGNOSIS — I472 Ventricular tachycardia: Secondary | ICD-10-CM | POA: Diagnosis present

## 2020-10-13 DIAGNOSIS — M109 Gout, unspecified: Secondary | ICD-10-CM | POA: Diagnosis present

## 2020-10-13 DIAGNOSIS — E663 Overweight: Secondary | ICD-10-CM | POA: Diagnosis present

## 2020-10-13 DIAGNOSIS — Z79899 Other long term (current) drug therapy: Secondary | ICD-10-CM | POA: Diagnosis not present

## 2020-10-13 DIAGNOSIS — Z8701 Personal history of pneumonia (recurrent): Secondary | ICD-10-CM | POA: Diagnosis not present

## 2020-10-13 DIAGNOSIS — I200 Unstable angina: Secondary | ICD-10-CM | POA: Diagnosis present

## 2020-10-13 DIAGNOSIS — Z951 Presence of aortocoronary bypass graft: Secondary | ICD-10-CM

## 2020-10-13 DIAGNOSIS — Z87442 Personal history of urinary calculi: Secondary | ICD-10-CM

## 2020-10-13 DIAGNOSIS — I25110 Atherosclerotic heart disease of native coronary artery with unstable angina pectoris: Principal | ICD-10-CM | POA: Diagnosis present

## 2020-10-13 DIAGNOSIS — Z01811 Encounter for preprocedural respiratory examination: Secondary | ICD-10-CM

## 2020-10-13 DIAGNOSIS — Z7901 Long term (current) use of anticoagulants: Secondary | ICD-10-CM | POA: Diagnosis not present

## 2020-10-13 DIAGNOSIS — R079 Chest pain, unspecified: Secondary | ICD-10-CM | POA: Diagnosis not present

## 2020-10-13 LAB — TROPONIN I (HIGH SENSITIVITY)
Troponin I (High Sensitivity): 23 ng/L — ABNORMAL HIGH (ref <18)
Troponin I (High Sensitivity): 25 ng/L — ABNORMAL HIGH (ref <18)

## 2020-10-13 LAB — CBC
HCT: 41.9 % (ref 39.0–52.0)
Hemoglobin: 14.1 g/dL (ref 13.0–17.0)
MCH: 30.1 pg (ref 26.0–34.0)
MCHC: 33.7 g/dL (ref 30.0–36.0)
MCV: 89.5 fL (ref 80.0–100.0)
Platelets: 195 10*3/uL (ref 150–400)
RBC: 4.68 MIL/uL (ref 4.22–5.81)
RDW: 12.2 % (ref 11.5–15.5)
WBC: 7 10*3/uL (ref 4.0–10.5)
nRBC: 0 % (ref 0.0–0.2)

## 2020-10-13 LAB — BASIC METABOLIC PANEL
Anion gap: 9 (ref 5–15)
BUN: 15 mg/dL (ref 8–23)
CO2: 25 mmol/L (ref 22–32)
Calcium: 9.1 mg/dL (ref 8.9–10.3)
Chloride: 103 mmol/L (ref 98–111)
Creatinine, Ser: 1.24 mg/dL (ref 0.61–1.24)
GFR, Estimated: >60 mL/min (ref >60)
Glucose, Bld: 99 mg/dL (ref 70–99)
Potassium: 3.8 mmol/L (ref 3.5–5.1)
Sodium: 137 mmol/L (ref 135–145)

## 2020-10-13 LAB — PROTIME-INR
INR: 1 (ref 0.8–1.2)
Prothrombin Time: 13.3 seconds (ref 11.4–15.2)

## 2020-10-13 LAB — RESP PANEL BY RT-PCR (FLU A&B, COVID) ARPGX2
Influenza A by PCR: NEGATIVE
Influenza B by PCR: NEGATIVE
SARS Coronavirus 2 by RT PCR: NEGATIVE

## 2020-10-13 MED ORDER — HEPARIN BOLUS VIA INFUSION
4000.0000 [IU] | Freq: Once | INTRAVENOUS | Status: AC
  Administered 2020-10-13: 4000 [IU] via INTRAVENOUS
  Filled 2020-10-13: qty 4000

## 2020-10-13 MED ORDER — ATORVASTATIN CALCIUM 80 MG PO TABS
80.0000 mg | ORAL_TABLET | Freq: Every day | ORAL | Status: DC
  Administered 2020-10-13 – 2020-10-21 (×8): 80 mg via ORAL
  Filled 2020-10-13: qty 2
  Filled 2020-10-13 (×2): qty 1
  Filled 2020-10-13 (×2): qty 2
  Filled 2020-10-13 (×3): qty 1

## 2020-10-13 MED ORDER — ASPIRIN 325 MG PO TABS
325.0000 mg | ORAL_TABLET | Freq: Every day | ORAL | Status: DC
  Administered 2020-10-13: 325 mg via ORAL
  Filled 2020-10-13: qty 1

## 2020-10-13 MED ORDER — HYDROXYZINE HCL 25 MG PO TABS
25.0000 mg | ORAL_TABLET | Freq: Three times a day (TID) | ORAL | Status: DC | PRN
  Administered 2020-10-19 – 2020-10-20 (×3): 25 mg via ORAL
  Filled 2020-10-13 (×3): qty 1

## 2020-10-13 MED ORDER — ACETAMINOPHEN 325 MG PO TABS
650.0000 mg | ORAL_TABLET | ORAL | Status: DC | PRN
  Administered 2020-10-13 – 2020-10-15 (×2): 650 mg via ORAL
  Filled 2020-10-13 (×2): qty 2

## 2020-10-13 MED ORDER — DICYCLOMINE HCL 10 MG PO CAPS
10.0000 mg | ORAL_CAPSULE | Freq: Four times a day (QID) | ORAL | Status: DC | PRN
  Filled 2020-10-13: qty 1

## 2020-10-13 MED ORDER — SERTRALINE HCL 100 MG PO TABS
100.0000 mg | ORAL_TABLET | Freq: Every day | ORAL | Status: DC
  Administered 2020-10-13 – 2020-10-21 (×8): 100 mg via ORAL
  Filled 2020-10-13 (×8): qty 1

## 2020-10-13 MED ORDER — CYCLOBENZAPRINE HCL 10 MG PO TABS
10.0000 mg | ORAL_TABLET | Freq: Three times a day (TID) | ORAL | Status: DC | PRN
  Filled 2020-10-13: qty 1

## 2020-10-13 MED ORDER — ONDANSETRON HCL 4 MG/2ML IJ SOLN: 4.0000 mg | Freq: Four times a day (QID) | INTRAMUSCULAR | Status: DC | PRN

## 2020-10-13 MED ORDER — TRAZODONE HCL 100 MG PO TABS
100.0000 mg | ORAL_TABLET | Freq: Every day | ORAL | Status: DC
  Administered 2020-10-13 – 2020-10-20 (×8): 100 mg via ORAL
  Filled 2020-10-13: qty 2
  Filled 2020-10-13: qty 1
  Filled 2020-10-13 (×2): qty 2
  Filled 2020-10-13: qty 1
  Filled 2020-10-13 (×2): qty 2
  Filled 2020-10-13: qty 1

## 2020-10-13 MED ORDER — NITROGLYCERIN 0.4 MG SL SUBL: 0.4000 mg | SUBLINGUAL_TABLET | SUBLINGUAL | Status: DC | PRN

## 2020-10-13 MED ORDER — OXYCODONE HCL 5 MG PO TABS
5.0000 mg | ORAL_TABLET | ORAL | Status: DC | PRN
  Administered 2020-10-13: 5 mg via ORAL
  Administered 2020-10-14 – 2020-10-15 (×2): 10 mg via ORAL
  Filled 2020-10-13: qty 1
  Filled 2020-10-13 (×2): qty 2

## 2020-10-13 MED ORDER — NITROGLYCERIN IN D5W 200-5 MCG/ML-% IV SOLN
0.0000 ug/min | INTRAVENOUS | Status: DC
  Administered 2020-10-13: 5 ug/min via INTRAVENOUS
  Filled 2020-10-13: qty 250

## 2020-10-13 MED ORDER — HEPARIN (PORCINE) 25000 UT/250ML-% IV SOLN
1550.0000 [IU]/h | INTRAVENOUS | Status: DC
  Administered 2020-10-13: 1300 [IU]/h via INTRAVENOUS
  Administered 2020-10-15 (×2): 1550 [IU]/h via INTRAVENOUS
  Filled 2020-10-13 (×4): qty 250

## 2020-10-13 MED ORDER — POLYETHYLENE GLYCOL 3350 17 G PO PACK: 17.0000 g | PACK | Freq: Every day | ORAL | Status: DC | PRN

## 2020-10-13 NOTE — ED Triage Notes (Signed)
Patient with active chest pain, nitro x2 with no relief. Pt awaiting bypass surgery.
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
## 2020-10-13 NOTE — Progress Notes (Signed)
ANTICOAGULATION CONSULT NOTE - Initial Consult  Pharmacy Consult for Heparin Indication: chest pain/ACS  No Known Allergies  Patient Measurements:   Heparin Dosing Weight: 94.6 kg  Vital Signs: Temp: 98.4 F (36.9 C) (07/17 1741) Temp Source: Oral (07/17 1741) BP: 145/99 (07/17 1930) Pulse Rate: 55 (07/17 1930)  Labs: Recent Labs    10/13/20 1740  HGB 14.1  HCT 41.9  PLT 195  CREATININE 1.24  TROPONINIHS 23*    CrCl cannot be calculated (Unknown ideal weight.).   Medical History: Past Medical History:  Diagnosis Date   Anxiety    Arthritis    BPH (benign prostatic hyperplasia)    Cancer ([facility])    hx of melanoma excised 15 years ago    CVA (cerebral vascular accident) ([place]) 2013   small amount of short term memory issues    Depression    ED (erectile dysfunction)    GERD (gastroesophageal reflux disease)    hx of    Gout    History of kidney stones    Hyperlipidemia    Hypertension    off of meds greater than a year - patient weaned himself off of meds    Pneumonia    hx of walking pneumonia    Tubular adenoma of colon 08/22/2013   [facility]    Medications:  (Not in a hospital admission)  Scheduled:   aspirin  325 mg Oral Daily   Infusions:   nitroGLYCERIN 10 mcg/min (10/13/20 1934)   Assessment: 30 yom presenting with chest pain. 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  Not on anticoagulation prior to arrival. CBC stable.  Goal of Therapy:  Heparin level 0.3-0.7 units/ml Monitor platelets by anticoagulation protocol: Yes   Plan:  Give 4000 units bolus x 1 Start heparin infusion at 1300 units/hr Check anti-Xa level in 6 hours and daily while on heparin Continue to monitor H&H and platelets  Lorelei Pont, PharmD, BCPS 10/13/2020 7:52 PM ED Clinical Pharmacist -  [phone]

## 2020-10-13 NOTE — ED Notes (Signed)
Attempted report x1. 

## 2020-10-14 ENCOUNTER — Encounter (HOSPITAL_COMMUNITY): Payer: Medicare Other

## 2020-10-14 ENCOUNTER — Inpatient Hospital Stay (HOSPITAL_COMMUNITY): Payer: Medicare Other

## 2020-10-14 ENCOUNTER — Inpatient Hospital Stay: Payer: Self-pay

## 2020-10-14 ENCOUNTER — Encounter (HOSPITAL_COMMUNITY): Payer: Self-pay | Admitting: Internal Medicine

## 2020-10-14 DIAGNOSIS — Z7901 Long term (current) use of anticoagulants: Secondary | ICD-10-CM

## 2020-10-14 DIAGNOSIS — I209 Angina pectoris, unspecified: Secondary | ICD-10-CM

## 2020-10-14 DIAGNOSIS — I200 Unstable angina: Secondary | ICD-10-CM

## 2020-10-14 DIAGNOSIS — R079 Chest pain, unspecified: Secondary | ICD-10-CM | POA: Diagnosis not present

## 2020-10-14 DIAGNOSIS — I25110 Atherosclerotic heart disease of native coronary artery with unstable angina pectoris: Secondary | ICD-10-CM

## 2020-10-14 DIAGNOSIS — Z01810 Encounter for preprocedural cardiovascular examination: Secondary | ICD-10-CM

## 2020-10-14 DIAGNOSIS — E785 Hyperlipidemia, unspecified: Secondary | ICD-10-CM

## 2020-10-14 LAB — HEMOGLOBIN A1C
Hgb A1c MFr Bld: 5.4 % (ref 4.8–5.6)
Mean Plasma Glucose: 108.28 mg/dL

## 2020-10-14 LAB — ECHOCARDIOGRAM COMPLETE
Area-P 1/2: 4.68 cm2
Height: 71 in
S' Lateral: 3.5 cm
Weight: 3376 oz

## 2020-10-14 LAB — SURGICAL PCR SCREEN
MRSA, PCR: POSITIVE — AB
Staphylococcus aureus: POSITIVE — AB

## 2020-10-14 LAB — BASIC METABOLIC PANEL
Anion gap: 9 (ref 5–15)
BUN: 15 mg/dL (ref 8–23)
CO2: 23 mmol/L (ref 22–32)
Calcium: 8.9 mg/dL (ref 8.9–10.3)
Chloride: 107 mmol/L (ref 98–111)
Creatinine, Ser: 1.23 mg/dL (ref 0.61–1.24)
GFR, Estimated: >60 mL/min (ref >60)
Glucose, Bld: 104 mg/dL — ABNORMAL HIGH (ref 70–99)
Potassium: 3.6 mmol/L (ref 3.5–5.1)
Sodium: 139 mmol/L (ref 135–145)

## 2020-10-14 LAB — LIPID PANEL
Cholesterol: 119 mg/dL (ref 0–200)
HDL: 34 mg/dL — ABNORMAL LOW (ref >40)
LDL Cholesterol: 52 mg/dL (ref 0–99)
Total CHOL/HDL Ratio: 3.5 RATIO
Triglycerides: 163 mg/dL — ABNORMAL HIGH (ref <150)
VLDL: 33 mg/dL (ref 0–40)

## 2020-10-14 LAB — CBC
HCT: 38.1 % — ABNORMAL LOW (ref 39.0–52.0)
Hemoglobin: 12.9 g/dL — ABNORMAL LOW (ref 13.0–17.0)
MCH: 30.3 pg (ref 26.0–34.0)
MCHC: 33.9 g/dL (ref 30.0–36.0)
MCV: 89.4 fL (ref 80.0–100.0)
Platelets: 172 10*3/uL (ref 150–400)
RBC: 4.26 MIL/uL (ref 4.22–5.81)
RDW: 12.2 % (ref 11.5–15.5)
WBC: 6.5 10*3/uL (ref 4.0–10.5)
nRBC: 0 % (ref 0.0–0.2)

## 2020-10-14 LAB — HEPARIN LEVEL (UNFRACTIONATED)
Heparin Unfractionated: 0.19 IU/mL — ABNORMAL LOW (ref 0.30–0.70)
Heparin Unfractionated: 0.6 IU/mL (ref 0.30–0.70)
Heparin Unfractionated: 0.34 IU/mL (ref 0.30–0.70)

## 2020-10-14 MED ORDER — ASPIRIN 81 MG PO CHEW
81.0000 mg | CHEWABLE_TABLET | Freq: Every day | ORAL | Status: DC
  Administered 2020-10-14 – 2020-10-15 (×2): 81 mg via ORAL
  Filled 2020-10-14 (×2): qty 1

## 2020-10-14 MED ORDER — CHLORHEXIDINE GLUCONATE CLOTH 2 % EX PADS
6.0000 | MEDICATED_PAD | Freq: Every day | CUTANEOUS | Status: DC
  Administered 2020-10-14 – 2020-10-15 (×2): 6 via TOPICAL

## 2020-10-14 MED ORDER — HEPARIN BOLUS VIA INFUSION
1500.0000 [IU] | Freq: Once | INTRAVENOUS | Status: AC
  Administered 2020-10-14: 1500 [IU] via INTRAVENOUS
  Filled 2020-10-14: qty 1500

## 2020-10-14 MED ORDER — MUPIROCIN 2 % EX OINT
1.0000 "application " | TOPICAL_OINTMENT | Freq: Two times a day (BID) | CUTANEOUS | Status: DC
  Administered 2020-10-14 – 2020-10-16 (×5): 1 via NASAL
  Filled 2020-10-14 (×3): qty 22

## 2020-10-14 NOTE — Progress Notes (Signed)
  Echocardiogram 2D Echocardiogram has been performed.  Harry Olson 10/14/2020, 2:47 PM

## 2020-10-14 NOTE — Progress Notes (Signed)
Date and time results received: 10/14/20 at 0137  (use smartphrase ".now" to insert current time)  Test: ]surgical screening Critical Value: MRSA and Staph Positive   Name of Provider Notified: Standing order placed  Orders Received? Or Actions Taken?:  standing order placed for MRSA

## 2020-10-14 NOTE — Progress Notes (Signed)
Pre-CABG Dopplers completed. Refer to "CV Proc" under chart review to view preliminary results.  10/14/2020 5:06 PM Kelby Aline., MHA, RVT, RDCS, RDMS   Carlos Levering, RVT

## 2020-10-14 NOTE — Progress Notes (Signed)
Discussed IS (2500 mL), sternal precautions, mobility post op, and d/c planning with pt and wife. Very receptive. Pt appreciative, all questions answered. Wife will be with him at d/c. [place] CES, ACSM 2:20 PM 10/14/2020

## 2020-10-14 NOTE — Progress Notes (Addendum)
Cardiology Progress Note  Patient ID: Harry Olson MRN: [id number] DOB: 11-29-1950 Date of Encounter: 10/14/2020  Primary Cardiologist: None  Subjective   Chief Complaint: Chest pain  HPI: 3v CAD at [place]. Admitted with unstable angina. CTS consult this morning.   ROS:  All other ROS reviewed and negative. Pertinent positives noted in the HPI.     Inpatient Medications  Scheduled Meds:  aspirin  81 mg Oral Daily   atorvastatin  80 mg Oral Daily   Chlorhexidine Gluconate Cloth  6 each Topical Q0600   mupirocin ointment  1 application Nasal BID   sertraline  100 mg Oral Daily   traZODone  100 mg Oral QHS   Continuous Infusions:  heparin 1,500 Units/hr (10/14/20 0406)   nitroGLYCERIN 10 mcg/min (10/13/20 1934)   PRN Meds: acetaminophen, cyclobenzaprine, dicyclomine, hydrOXYzine, nitroGLYCERIN, ondansetron (ZOFRAN) IV, oxyCODONE, polyethylene glycol   Vital Signs   Vitals:   10/14/20 0349 10/14/20 0400 10/14/20 0500 10/14/20 0724  BP: 114/67 113/69 132/76 (!) 100/58  Pulse: (!) 58 62 (!) 50 (!) 56  Resp: 18 16 16 20   Temp: (!) 97 F (36.1 C)   97.6 F (36.4 C)  TempSrc: Axillary   Oral  SpO2: 92% 91% 93% 94%  Weight:      Height:       No intake or output data in the 24 hours ending 10/14/20 0757 Last 3 Weights 10/13/2020 01/11/2017 01/05/2017  Weight (lbs) 211 lb 195 lb 195 lb  Weight (kg) 95.709 kg 88.451 kg 88.451 kg      Telemetry  Overnight telemetry shows SB 50s, which I personally reviewed.   ECG  The most recent ECG shows SB 50 bpm, no acute STT changes, which I personally reviewed.   Physical Exam   Vitals:   10/14/20 0349 10/14/20 0400 10/14/20 0500 10/14/20 0724  BP: 114/67 113/69 132/76 (!) 100/58  Pulse: (!) 58 62 (!) 50 (!) 56  Resp: 18 16 16 20   Temp: (!) 97 F (36.1 C)   97.6 F (36.4 C)  TempSrc: Axillary   Oral  SpO2: 92% 91% 93% 94%  Weight:      Height:       No intake or output data in the 24 hours ending 10/14/20 0757   Last 3 Weights 10/13/2020 01/11/2017 01/05/2017  Weight (lbs) 211 lb 195 lb 195 lb  Weight (kg) 95.709 kg 88.451 kg 88.451 kg    Body mass index is 29.43 kg/m.   General: Well nourished, well developed, in no acute distress Head: Atraumatic, normal size  Eyes: PEERLA, EOMI  Neck: Supple, no JVD Endocrine: No thryomegaly Cardiac: Normal S1, S2; RRR; no murmurs, rubs, or gallops Lungs: Clear to auscultation bilaterally, no wheezing, rhonchi or rales  Abd: Soft, nontender, no hepatomegaly  Ext: No edema, pulses 2+ Musculoskeletal: No deformities, BUE and BLE strength normal and equal Skin: Warm and dry, no rashes   Neuro: Alert and oriented to person, place, time, and situation, CNII-XII grossly intact, no focal deficits  Psych: Normal mood and affect   Labs  High Sensitivity Troponin:   Recent Labs  Lab 10/13/20 1740 10/13/20 1940  TROPONINIHS 23* 25*     Cardiac EnzymesNo results for input(s): TROPONINI in the last 168 hours. No results for input(s): TROPIPOC in the last 168 hours.  Chemistry Recent Labs  Lab 10/13/20 1740 10/14/20 0304  NA 137 139  K 3.8 3.6  CL 103 107  CO2 25 23  GLUCOSE 99 104*  BUN 15 15  CREATININE 1.24 1.23  CALCIUM 9.1 8.9  GFRNONAA >60 >60  ANIONGAP 9 9    Hematology Recent Labs  Lab 10/13/20 1740 10/14/20 0304  WBC 7.0 6.5  RBC 4.68 4.26  HGB 14.1 12.9*  HCT 41.9 38.1*  MCV 89.5 89.4  MCH 30.1 30.3  MCHC 33.7 33.9  RDW 12.2 12.2  PLT 195 172   BNPNo results for input(s): BNP, PROBNP in the last 168 hours.  DDimer No results for input(s): DDIMER in the last 168 hours.   Radiology  DG Chest 2 View  Result Date: 10/13/2020 CLINICAL DATA:  Chest pain EXAM: CHEST - 2 VIEW COMPARISON:  None. FINDINGS: Streaky atelectasis or scarring at left base. Hyperinflated lungs. No pleural effusion or pneumothorax. Nodule versus osseous summation artifact at the left upper lobe. IMPRESSION: 1. Streaky atelectasis or scarring at left base. 2.  Nodule versus artifact at the left upper lobe, suggest chest CT for further evaluation. Electronically Signed   By: Donavan Foil M.D.   On: 10/13/2020 18:08    Cardiac Studies  LHC 10/08/2020 This result has an attachment that is not available.   Dist RCA lesion is 100% stenosed.   Prox LAD lesion is 90% stenosed.   Mid LAD lesion is 80% stenosed.   Prox RCA to Mid RCA lesion is 50% stenosed.   1st Mrg lesion is 90% stenosed.   Prox Cx to Mid Cx lesion is 80% stenosed.   Dist Cx lesion is 90% stenosed.    Patient Profile  Harry Olson is a 70 y.o. male with 3v CAD, HTN, HLD who was admitted 10/13/2020 with progressive angina/unstable angina.   Assessment & Plan   Unstable Angina -[facility] at [facility] with 3 vessel CAD. He had planned for outpatient CABG evaluation. Developed chest pain symptoms at rest.  -Troponin negative.  -EKG without ischemic changes.  -continue ASA 81 mg daily. Heparin drip. No BB given sinus bradycardia. Lipitor 80 mg.  -stop NTG drip. SL NTG as needed.  -plan for Echo, carotid US, and CT surgery consult today.  -no CP and is stable currently.   2. HLD -lipitor 80 mg daily  3. Depression/Insomnia -home meds  4. Abnormal CXR -nodule versus artifact at the left upper lobe on CXR -Chest CT for evaluation  FEN -no IVF -diet: heart healthy -dvt ppx: heparin drip -code: full   For questions or updates, please contact [place] Please consult [email or web address] for contact info under   Time Spent with Patient: I have spent a total of 35 minutes with patient reviewing hospital notes, telemetry, EKGs, labs and examining the patient as well as establishing an assessment and plan that was discussed with the patient.  > 50% of time was spent in direct patient care.    Signed, Addison Naegeli. Audie Box, MD, [place]  10/14/2020 7:57 AM
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
## 2020-10-14 NOTE — Plan of Care (Signed)
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
## 2020-10-14 NOTE — Progress Notes (Signed)
ANTICOAGULATION CONSULT NOTE - Follow Up Consult  Pharmacy Consult for IV Heparin Indication: chest pain/ACS, CAD w/3 vessel disease   No Known Allergies  Patient Measurements: Height: 5\' 11"  (180.3 cm) Weight: 95.7 kg (211 lb) IBW/kg (Calculated) : 75.3 Heparin Dosing Weight: 94.6 kg  Vital Signs: Temp: 98 F (36.7 C) (07/18 1926) Temp Source: Oral (07/18 1926) BP: 181/96 (07/18 1926) Pulse Rate: 63 (07/18 1926)  Labs: Recent Labs    10/13/20 1740 10/13/20 1940 10/13/20 1953 10/14/20 0304 10/14/20 1209 10/14/20 1806  HGB 14.1  --   --  12.9*  --   --   HCT 41.9  --   --  38.1*  --   --   PLT 195  --   --  172  --   --   LABPROT  --   --  13.3  --   --   --   INR  --   --  1.0  --   --   --   HEPARINUNFRC  --   --   --  0.19* 0.60 0.34  CREATININE 1.24  --   --  1.23  --   --   TROPONINIHS 23* 25*  --   --   --   --      Estimated Creatinine Clearance: 66 mL/min (by C-G formula based on SCr of 1.23 mg/dL).   Assessment: 70 yr old man presented with chest pain. 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 Pt recently had LHC at [facility] showing 3-vessel disease; awaiting imaging from [facility]. Of note, pt reported (per med rec) that he was on Xarelto PTA (last dose reported to be on 10/13/20); however, initial heparin level after starting heparin infusion was low (0.19 units/ml), indicating that Xarelto is not influencing heparin level.  Confirmatory heparin level on heparin infusion at 1500 units/hr (drawn ~6 hrs after initial therapeutic heparin level) was 0.34 units/ml, which is at the low end of the goal range for this pt. H/H 12.9/38.1, plt 172. Per RN, no issues with IV or bleeding observed.  Pt is tentatively scheduled for CABG on 10/16/20  Goal of Therapy:  Heparin level 0.3-0.7 units/ml Monitor platelets by anticoagulation protocol: Yes   Plan:  Increase  heparin infusion to 1550 units/hr Check heparin level in 6 hrs Monitor daily heparin level, CBC Monitor for bleeding  Thank you for involving pharmacy in this patient's care.  Gillermina Hu, PharmD, BCPS, [facility] Clinical Pharmacist 10/14/2020 7:56 PM

## 2020-10-14 NOTE — Progress Notes (Signed)
[facility] for Heparin Indication: chest pain/ACS  No Known Allergies  Patient Measurements: Height: 5\' 11"  (180.3 cm) Weight: 95.7 kg (211 lb) IBW/kg (Calculated) : 75.3 Heparin Dosing Weight: 94.6 kg  Vital Signs: Temp: 97 F (36.1 C) (07/18 0349) Temp Source: Axillary (07/18 0349) BP: 114/67 (07/18 0349) Pulse Rate: 58 (07/18 0349)  Labs: Recent Labs    10/13/20 1740 10/13/20 1940 10/13/20 1953 10/14/20 0304  HGB 14.1  --   --  12.9*  HCT 41.9  --   --  38.1*  PLT 195  --   --  172  LABPROT  --   --  13.3  --   INR  --   --  1.0  --   HEPARINUNFRC  --   --   --  0.19*  CREATININE 1.24  --   --  1.23  TROPONINIHS 23* 25*  --   --      Estimated Creatinine Clearance: 66 mL/min (by C-G formula based on SCr of 1.23 mg/dL).   Assessment: 63 yom presenting with chest pain. 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  Heparin level subtherapeutic (0.19) on gtt at 1300 units/hr. No issues with line or bleeding reported per RN.  Goal of Therapy:  Heparin level 0.3-0.7 units/ml Monitor platelets by anticoagulation protocol: Yes   Plan:  Rebolus heparin 1500 units  Increase heparin to 1500 units/hr Will f/u 8 hr heparin level  Sherlon Handing, PharmD, BCPS Please see amion for complete clinical pharmacist phone list 10/14/2020 3:57 AM

## 2020-10-14 NOTE — Progress Notes (Signed)
ANTICOAGULATION CONSULT NOTE  Pharmacy Consult for Heparin Indication: chest pain/ACS, CAD w/3 vessel disease   No Known Allergies  Patient Measurements: Height: 5\' 11"  (180.3 cm) Weight: 95.7 kg (211 lb) IBW/kg (Calculated) : 75.3 Heparin Dosing Weight: 94.6 kg  Vital Signs: Temp: 97.6 F (36.4 C) (07/18 1122) Temp Source: Oral (07/18 0724) BP: 145/77 (07/18 1122) Pulse Rate: 56 (07/18 1122)  Labs: Recent Labs    10/13/20 1740 10/13/20 1940 10/13/20 1953 10/14/20 0304 10/14/20 1209  HGB 14.1  --   --  12.9*  --   HCT 41.9  --   --  38.1*  --   PLT 195  --   --  172  --   LABPROT  --   --  13.3  --   --   INR  --   --  1.0  --   --   HEPARINUNFRC  --   --   --  0.19* 0.60  CREATININE 1.24  --   --  1.23  --   TROPONINIHS 23* 25*  --   --   --      Estimated Creatinine Clearance: 66 mL/min (by C-G formula based on SCr of 1.23 mg/dL).   Assessment: 53 yom presenting with chest pain. 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 Pt recently had LHC at [facility] showing 3-vessel disease and awaiting imaging from [place].   Heparin level 0.60 and therapeutic. No signs/sx of bleeding or infusion issues per RN. Hgb/Hct and PLT stable and WNL.   Goal of Therapy:  Heparin level 0.3-0.7 units/ml Monitor platelets by anticoagulation protocol: Yes   Plan:  Continue IV heparin gtt @ 1500 units/hr Confirmatory heparin level check in 8 h Daily Heparin level, CBC Monitor for s/sx of bleeding  Thank you for involving pharmacy in this patient's care.  Elita Quick, PharmD PGY1 Ambulatory Care Pharmacy Resident 10/14/2020 1:34 PM  **Pharmacist phone directory can be found on [email or web address] listed under [place]**

## 2020-10-15 ENCOUNTER — Inpatient Hospital Stay: Payer: Self-pay

## 2020-10-15 ENCOUNTER — Inpatient Hospital Stay (HOSPITAL_COMMUNITY): Payer: Medicare Other

## 2020-10-15 ENCOUNTER — Encounter (HOSPITAL_COMMUNITY): Payer: Medicare Other

## 2020-10-15 DIAGNOSIS — I200 Unstable angina: Secondary | ICD-10-CM | POA: Diagnosis not present

## 2020-10-15 LAB — COMPREHENSIVE METABOLIC PANEL
ALT: 23 U/L (ref 0–44)
AST: 21 U/L (ref 15–41)
Albumin: 3.5 g/dL (ref 3.5–5.0)
Alkaline Phosphatase: 60 U/L (ref 38–126)
Anion gap: 8 (ref 5–15)
BUN: 12 mg/dL (ref 8–23)
CO2: 24 mmol/L (ref 22–32)
Calcium: 9.3 mg/dL (ref 8.9–10.3)
Chloride: 106 mmol/L (ref 98–111)
Creatinine, Ser: 1.24 mg/dL (ref 0.61–1.24)
GFR, Estimated: >60 mL/min (ref >60)
Glucose, Bld: 167 mg/dL — ABNORMAL HIGH (ref 70–99)
Potassium: 3.5 mmol/L (ref 3.5–5.1)
Sodium: 138 mmol/L (ref 135–145)
Total Bilirubin: 0.9 mg/dL (ref 0.3–1.2)
Total Protein: 6.6 g/dL (ref 6.5–8.1)

## 2020-10-15 LAB — CBC
HCT: 42 % (ref 39.0–52.0)
Hemoglobin: 14.2 g/dL (ref 13.0–17.0)
MCH: 30 pg (ref 26.0–34.0)
MCHC: 33.8 g/dL (ref 30.0–36.0)
MCV: 88.8 fL (ref 80.0–100.0)
Platelets: 198 10*3/uL (ref 150–400)
RBC: 4.73 MIL/uL (ref 4.22–5.81)
RDW: 12.1 % (ref 11.5–15.5)
WBC: 8.3 10*3/uL (ref 4.0–10.5)
nRBC: 0 % (ref 0.0–0.2)

## 2020-10-15 LAB — BASIC METABOLIC PANEL
Anion gap: 7 (ref 5–15)
BUN: 11 mg/dL (ref 8–23)
CO2: 26 mmol/L (ref 22–32)
Calcium: 9.3 mg/dL (ref 8.9–10.3)
Chloride: 105 mmol/L (ref 98–111)
Creatinine, Ser: 1.21 mg/dL (ref 0.61–1.24)
GFR, Estimated: >60 mL/min (ref >60)
Glucose, Bld: 118 mg/dL — ABNORMAL HIGH (ref 70–99)
Potassium: 3.7 mmol/L (ref 3.5–5.1)
Sodium: 138 mmol/L (ref 135–145)

## 2020-10-15 LAB — BLOOD GAS, ARTERIAL
Acid-base deficit: 0.6 mmol/L (ref 0.0–2.0)
Bicarbonate: 23.4 mmol/L (ref 20.0–28.0)
Drawn by: 38235
FIO2: 21
O2 Saturation: 95.1 %
Patient temperature: 37
pCO2 arterial: 37.7 mmHg (ref 32.0–48.0)
pH, Arterial: 7.41 (ref 7.350–7.450)
pO2, Arterial: 75.6 mmHg — ABNORMAL LOW (ref 83.0–108.0)

## 2020-10-15 LAB — PULMONARY FUNCTION TEST
FEF 25-75 Pre: 1.23 L/sec
FEF2575-%Pred-Pre: 47 %
FEV1-%Pred-Pre: 59 %
FEV1-Pre: 2.03 L
FEV1FVC-%Pred-Pre: 89 %
FEV6-%Pred-Pre: 67 %
FEV6-Pre: 2.94 L
FEV6FVC-%Pred-Pre: 102 %
FVC-%Pred-Pre: 67 %
FVC-Pre: 3.09 L
Pre FEV1/FVC ratio: 66 %
Pre FEV6/FVC Ratio: 97 %

## 2020-10-15 LAB — PREPARE RBC (CROSSMATCH)

## 2020-10-15 LAB — HEPARIN LEVEL (UNFRACTIONATED): Heparin Unfractionated: 0.32 IU/mL (ref 0.30–0.70)

## 2020-10-15 MED ORDER — DEXMEDETOMIDINE HCL IN NACL 400 MCG/100ML IV SOLN
0.1000 ug/kg/h | INTRAVENOUS | Status: AC
  Administered 2020-10-16: .5 ug/kg/h via INTRAVENOUS
  Filled 2020-10-15: qty 100

## 2020-10-15 MED ORDER — BISACODYL 5 MG PO TBEC: 5.0000 mg | DELAYED_RELEASE_TABLET | Freq: Once | ORAL | Status: DC

## 2020-10-15 MED ORDER — TRANEXAMIC ACID (OHS) PUMP PRIME SOLUTION
2.0000 mg/kg | INTRAVENOUS | Status: DC
  Filled 2020-10-15: qty 1.92

## 2020-10-15 MED ORDER — CHLORHEXIDINE GLUCONATE 0.12 % MT SOLN
15.0000 mL | Freq: Once | OROMUCOSAL | Status: AC
  Administered 2020-10-16: 15 mL via OROMUCOSAL
  Filled 2020-10-15: qty 15

## 2020-10-15 MED ORDER — VANCOMYCIN HCL 1500 MG/300ML IV SOLN
1500.0000 mg | INTRAVENOUS | Status: AC
  Administered 2020-10-16: 1500 mg via INTRAVENOUS
  Filled 2020-10-15: qty 300

## 2020-10-15 MED ORDER — INSULIN REGULAR(HUMAN) IN NACL 100-0.9 UT/100ML-% IV SOLN
INTRAVENOUS | Status: AC
  Administered 2020-10-16: 1.3 [IU]/h via INTRAVENOUS
  Filled 2020-10-15: qty 100

## 2020-10-15 MED ORDER — METOPROLOL TARTRATE 12.5 MG HALF TABLET
12.5000 mg | ORAL_TABLET | Freq: Once | ORAL | Status: AC
  Administered 2020-10-16: 12.5 mg via ORAL
  Filled 2020-10-15: qty 1

## 2020-10-15 MED ORDER — CHLORHEXIDINE GLUCONATE CLOTH 2 % EX PADS
6.0000 | MEDICATED_PAD | Freq: Once | CUTANEOUS | Status: AC
  Administered 2020-10-15: 6 via TOPICAL

## 2020-10-15 MED ORDER — TRANEXAMIC ACID 1000 MG/10ML IV SOLN
1.5000 mg/kg/h | INTRAVENOUS | Status: AC
  Administered 2020-10-16: 1.5 mg/kg/h via INTRAVENOUS
  Filled 2020-10-15: qty 25

## 2020-10-15 MED ORDER — CHLORHEXIDINE GLUCONATE CLOTH 2 % EX PADS
6.0000 | MEDICATED_PAD | Freq: Once | CUTANEOUS | Status: AC
  Administered 2020-10-16: 6 via TOPICAL

## 2020-10-15 MED ORDER — POTASSIUM CHLORIDE 2 MEQ/ML IV SOLN
80.0000 meq | INTRAVENOUS | Status: DC
  Filled 2020-10-15: qty 40

## 2020-10-15 MED ORDER — CEFAZOLIN SODIUM-DEXTROSE 2-4 GM/100ML-% IV SOLN
2.0000 g | INTRAVENOUS | Status: AC
  Administered 2020-10-16: 2 g via INTRAVENOUS
  Filled 2020-10-15 (×2): qty 100

## 2020-10-15 MED ORDER — PHENYLEPHRINE HCL-NACL 20-0.9 MG/250ML-% IV SOLN
30.0000 ug/min | INTRAVENOUS | Status: AC
  Administered 2020-10-16: 25 ug/min via INTRAVENOUS
  Filled 2020-10-15 (×2): qty 250

## 2020-10-15 MED ORDER — TRANEXAMIC ACID (OHS) BOLUS VIA INFUSION
15.0000 mg/kg | INTRAVENOUS | Status: AC
  Administered 2020-10-16: 1252.5 mg via INTRAVENOUS
  Filled 2020-10-15: qty 1253

## 2020-10-15 MED ORDER — PLASMA-LYTE A IV SOLN
INTRAVENOUS | Status: DC
  Filled 2020-10-15: qty 5

## 2020-10-15 MED ORDER — TEMAZEPAM 15 MG PO CAPS
15.0000 mg | ORAL_CAPSULE | Freq: Once | ORAL | Status: AC | PRN
  Administered 2020-10-15: 15 mg via ORAL
  Filled 2020-10-15: qty 1

## 2020-10-15 MED ORDER — MILRINONE LACTATE IN DEXTROSE 20-5 MG/100ML-% IV SOLN
0.3000 ug/kg/min | INTRAVENOUS | Status: DC
  Filled 2020-10-15: qty 100

## 2020-10-15 MED ORDER — NOREPINEPHRINE 4 MG/250ML-% IV SOLN
0.0000 ug/min | INTRAVENOUS | Status: DC
  Filled 2020-10-15 (×2): qty 250

## 2020-10-15 MED ORDER — SODIUM CHLORIDE 0.9 % IV SOLN
INTRAVENOUS | Status: DC
  Filled 2020-10-15: qty 30

## 2020-10-15 MED ORDER — NITROGLYCERIN IN D5W 200-5 MCG/ML-% IV SOLN
2.0000 ug/min | INTRAVENOUS | Status: DC
  Filled 2020-10-15 (×2): qty 250

## 2020-10-15 MED ORDER — MANNITOL 20 % IV SOLN
Freq: Once | INTRAVENOUS | Status: DC
  Filled 2020-10-15: qty 13

## 2020-10-15 MED ORDER — ACETAMINOPHEN 500 MG PO TABS
1000.0000 mg | ORAL_TABLET | Freq: Once | ORAL | Status: AC
  Administered 2020-10-15: 1000 mg via ORAL
  Filled 2020-10-15: qty 2

## 2020-10-15 MED ORDER — EPINEPHRINE HCL 5 MG/250ML IV SOLN IN NS
0.0000 ug/min | INTRAVENOUS | Status: DC
  Filled 2020-10-15: qty 250

## 2020-10-15 NOTE — Progress Notes (Signed)
CARDIAC REHAB PHASE I   Reinforced preop education. Pt able to verbalize understanding. Voices some concerns regarding surgery. Provided support and encouragement. Pt requesting wife stay the night, director aware. Pt c/o "indigestion", milk given. Will continue to follow throughout hospital stay.  [id number] Rufina Falco, RN BSN 10/15/2020 11:14 AM

## 2020-10-15 NOTE — Progress Notes (Signed)
Cardiology Progress Note  Patient ID: Harry Olson MRN: [id number] DOB: [birth year] Date of Encounter: 10/15/2020  Primary Cardiologist: None  Subjective   Chief Complaint: None.  HPI: Chest pain-free.  Awaiting CABG tomorrow.  Still trying to obtain cath films from [facility].  ROS:  All other ROS reviewed and negative. Pertinent positives noted in the HPI.     Inpatient Medications  Scheduled Meds:  aspirin  81 mg Oral Daily   atorvastatin  80 mg Oral Daily   Chlorhexidine Gluconate Cloth  6 each Topical Q0600   mupirocin ointment  1 application Nasal BID   sertraline  100 mg Oral Daily   traZODone  100 mg Oral QHS   Continuous Infusions:  heparin 1,550 Units/hr (10/15/20 0326)   PRN Meds: acetaminophen, cyclobenzaprine, dicyclomine, hydrOXYzine, nitroGLYCERIN, ondansetron (ZOFRAN) IV, oxyCODONE, polyethylene glycol   Vital Signs   Vitals:   10/14/20 1926 10/14/20 2319 10/15/20 0418 10/15/20 0729  BP: (!) 181/96 (!) 152/70 (!) 113/58 127/66  Pulse: 63 60 (!) 58 (!) 54  Resp: 20 16 18 13   Temp: 98 F (36.7 C) 98 F (36.7 C) 97.7 F (36.5 C) 97.7 F (36.5 C)  TempSrc: Oral Oral Oral Oral  SpO2: 99% 100% 99% 93%  Weight:   95.8 kg   Height:        Intake/Output Summary (Last 24 hours) at 10/15/2020 0832 Last data filed at 10/15/2020 0734 Gross per 24 hour  Intake 938.27 ml  Output 750 ml  Net 188.27 ml   Last 3 Weights 10/15/2020 10/13/2020 01/11/2017  Weight (lbs) 211 lb 3.2 oz 211 lb 195 lb  Weight (kg) 95.8 kg 95.709 kg 88.451 kg      Telemetry  Overnight telemetry shows sinus bradycardia heart rate in the 50s, which I personally reviewed.   Physical Exam   Vitals:   10/14/20 1926 10/14/20 2319 10/15/20 0418 10/15/20 0729  BP: (!) 181/96 (!) 152/70 (!) 113/58 127/66  Pulse: 63 60 (!) 58 (!) 54  Resp: 20 16 18 13   Temp: 98 F (36.7 C) 98 F (36.7 C) 97.7 F (36.5 C) 97.7 F (36.5 C)  TempSrc: Oral Oral Oral Oral  SpO2: 99% 100% 99% 93%   Weight:   95.8 kg   Height:        Intake/Output Summary (Last 24 hours) at 10/15/2020 0832 Last data filed at 10/15/2020 0734 Gross per 24 hour  Intake 938.27 ml  Output 750 ml  Net 188.27 ml    Last 3 Weights 10/15/2020 10/13/2020 01/11/2017  Weight (lbs) 211 lb 3.2 oz 211 lb 195 lb  Weight (kg) 95.8 kg 95.709 kg 88.451 kg    Body mass index is 29.46 kg/m.  General: Well nourished, well developed, in no acute distress Head: Atraumatic, normal size  Eyes: PEERLA, EOMI  Neck: Supple, no JVD Endocrine: No thryomegaly Cardiac: Normal S1, S2; RRR; no murmurs, rubs, or gallops Lungs: Clear to auscultation bilaterally, no wheezing, rhonchi or rales  Abd: Soft, nontender, no hepatomegaly  Ext: No edema, pulses 2+ Musculoskeletal: No deformities, BUE and BLE strength normal and equal Skin: Warm and dry, no rashes   Neuro: Alert and oriented to person, place, time, and situation, CNII-XII grossly intact, no focal deficits  Psych: Normal mood and affect   Labs  High Sensitivity Troponin:   Recent Labs  Lab 10/13/20 1740 10/13/20 1940  TROPONINIHS 23* 25*     Cardiac EnzymesNo results for input(s): TROPONINI in the last 168 hours. No results  for input(s): TROPIPOC in the last 168 hours.  Chemistry Recent Labs  Lab 10/13/20 1740 10/14/20 0304 10/15/20 0034  NA 137 139 138  K 3.8 3.6 3.7  CL 103 107 105  CO2 25 23 26   GLUCOSE 99 104* 118*  BUN 15 15 11   CREATININE 1.24 1.23 1.21  CALCIUM 9.1 8.9 9.3  GFRNONAA >60 >60 >60  ANIONGAP 9 9 7     Hematology Recent Labs  Lab 10/13/20 1740 10/14/20 0304 10/15/20 0034  WBC 7.0 6.5 8.3  RBC 4.68 4.26 4.73  HGB 14.1 12.9* 14.2  HCT 41.9 38.1* 42.0  MCV 89.5 89.4 88.8  MCH 30.1 30.3 30.0  MCHC 33.7 33.9 33.8  RDW 12.2 12.2 12.1  PLT 195 172 198   BNPNo results for input(s): BNP, PROBNP in the last 168 hours.  DDimer No results for input(s): DDIMER in the last 168 hours.   Radiology  DG Chest 2 View  Result Date:  10/13/2020 CLINICAL DATA:  Chest pain EXAM: CHEST - 2 VIEW COMPARISON:  None. FINDINGS: Streaky atelectasis or scarring at left base. Hyperinflated lungs. No pleural effusion or pneumothorax. Nodule versus osseous summation artifact at the left upper lobe. IMPRESSION: 1. Streaky atelectasis or scarring at left base. 2. Nodule versus artifact at the left upper lobe, suggest chest CT for further evaluation. Electronically Signed   By: Donavan Foil M.D.   On: 10/13/2020 18:08   CT CHEST WO CONTRAST  Result Date: 10/14/2020 CLINICAL DATA:  Chest pain.  Question lung nodule on radiograph. EXAM: CT CHEST WITHOUT CONTRAST TECHNIQUE: Multidetector CT imaging of the chest was performed following the standard protocol without IV contrast. COMPARISON:  Radiograph yesterday. FINDINGS: Cardiovascular: Aortic atherosclerosis. No aneurysm. Left vertebral artery arises directly from the thoracic aorta, variant anatomy. Coronary artery calcifications. Heart is normal in size. Trace pericardial fluid/thickening anteriorly. Mediastinum/Nodes: Scattered small mediastinal lymph nodes not enlarged by size criteria. Limited assessment for hilar adenopathy in this unenhanced exam. There is mild wall thickening of the distal esophagus. Surgical clips in the right axilla. No axillary adenopathy no visualized thyroid nodule. Lungs/Pleura: No pulmonary nodule, particularly no nodule in the left upper lobe corresponding to that on prior radiograph. There is mild biapical pleuroparenchymal scarring. Minimal subsegmental and hypoventilatory atelectasis in the lung bases. No consolidation or pleural effusion. No pulmonary edema. Trachea and central bronchi are patent. Upper Abdomen: 5 mm calcification in the upper left kidney is adjacent 2 cortical cysts, may be a wall calcification or nonobstructing stone. Cysts in the mid right kidney are partially included. No acute upper abdominal findings. Musculoskeletal: Thoracic spondylosis with  endplate spurring. There are no acute or suspicious osseous abnormalities. Mild hypertrophy of the left first rib costochondral junction accounting for radiographic appearance of nodule. IMPRESSION: 1. No pulmonary nodule, particularly no nodule in the left upper lobe corresponding to that on prior radiograph. Radiographic findings were related to the costochondral cartilage of the first rib. 2. Mild wall thickening of the distal esophagus, can be seen with reflux or esophagitis. 3. Aortic atherosclerosis.  Coronary artery calcifications. Aortic Atherosclerosis (ICD10-I70.0). Electronically Signed   By: Keith Rake M.D.   On: 10/14/2020 17:43   ECHOCARDIOGRAM COMPLETE  Result Date: 10/14/2020    ECHOCARDIOGRAM REPORT   Patient Name:   Harry Olson Date of Exam: 10/14/2020 Medical Rec #:  [id number]        Height:       71.0 in Accession #:    [id number]  Weight:       211.0 lb Date of Birth:  1950-03-31        BSA:          2.157 m Patient Age:    70 years         BP:           100/58 mmHg Patient Gender: M                HR:           56 bpm. Exam Location:  Inpatient Procedure: 2D Echo, Color Doppler and Cardiac Doppler Indications:    Acute myocardial infarction, unspecified I21.9  History:        Patient has no prior history of Echocardiogram examinations.                 Stroke; Risk Factors:Dyslipidemia and Hypertension.  Sonographer:    Bernadene Person RDCS Referring Phys: [id number] Harry Olson IMPRESSIONS  1. Left ventricular ejection fraction, by estimation, is 60 to 65%. The left ventricle has normal function. The left ventricle has no regional wall motion abnormalities. Left ventricular diastolic parameters are consistent with Grade II diastolic dysfunction (pseudonormalization).  2. Right ventricular systolic function is normal. The right ventricular size is normal.  3. The mitral valve is normal in structure. No evidence of mitral valve regurgitation. No evidence of mitral stenosis.   4. The aortic valve is normal in structure. Aortic valve regurgitation is not visualized. No aortic stenosis is present.  5. The inferior vena cava is normal in size with greater than 50% respiratory variability, suggesting right atrial pressure of 3 mmHg. FINDINGS  Left Ventricle: Left ventricular ejection fraction, by estimation, is 60 to 65%. The left ventricle has normal function. The left ventricle has no regional wall motion abnormalities. The left ventricular internal cavity size was normal in size. There is  no left ventricular hypertrophy. Left ventricular diastolic parameters are consistent with Grade II diastolic dysfunction (pseudonormalization). Right Ventricle: The right ventricular size is normal. No increase in right ventricular wall thickness. Right ventricular systolic function is normal. Left Atrium: Left atrial size was normal in size. Right Atrium: Right atrial size was normal in size. Pericardium: There is no evidence of pericardial effusion. Mitral Valve: The mitral valve is normal in structure. No evidence of mitral valve regurgitation. No evidence of mitral valve stenosis. Tricuspid Valve: The tricuspid valve is normal in structure. Tricuspid valve regurgitation is not demonstrated. No evidence of tricuspid stenosis. Aortic Valve: The aortic valve is normal in structure. Aortic valve regurgitation is not visualized. No aortic stenosis is present. Pulmonic Valve: The pulmonic valve was normal in structure. Pulmonic valve regurgitation is not visualized. No evidence of pulmonic stenosis. Aorta: The aortic root is normal in size and structure. Venous: The inferior vena cava is normal in size with greater than 50% respiratory variability, suggesting right atrial pressure of 3 mmHg. IAS/Shunts: No atrial level shunt detected by color flow Doppler.  LEFT VENTRICLE PLAX 2D LVIDd:         5.40 cm  Diastology LVIDs:         3.50 cm  LV e' medial:    6.56 cm/s LV PW:         1.00 cm  LV E/e' medial:   15.5 LV IVS:        0.90 cm  LV e' lateral:   8.00 cm/s LVOT diam:     2.10 cm  LV E/e' lateral: 12.8 LV  SV:         73 LV SV Index:   34 LVOT Area:     3.46 cm  RIGHT VENTRICLE RV S prime:     18.80 cm/s TAPSE (M-mode): 2.3 cm LEFT ATRIUM             Index       RIGHT ATRIUM           Index LA diam:        2.60 cm 1.21 cm/m  RA Area:     13.50 cm LA Vol (A2C):   42.2 ml 19.56 ml/m RA Volume:   26.00 ml  12.05 ml/m LA Vol (A4C):   47.5 ml 22.02 ml/m LA Biplane Vol: 47.6 ml 22.07 ml/m  AORTIC VALVE LVOT Vmax:   104.00 cm/s LVOT Vmean:  64.500 cm/s LVOT VTI:    0.210 m  AORTA Ao Root diam: 3.30 cm Ao Asc diam:  3.20 cm MITRAL VALVE MV Area (PHT): 4.68 cm     SHUNTS MV Decel Time: 162 msec     Systemic VTI:  0.21 m MV E velocity: 102.00 cm/s  Systemic Diam: 2.10 cm MV A velocity: 77.10 cm/s MV E/A ratio:  1.32 Candee Furbish MD Electronically signed by Candee Furbish MD Signature Date/Time: 10/14/2020/3:37:43 PM    Final    VAS US DOPPLER PRE CABG  Result Date: 10/14/2020 PREOPERATIVE VASCULAR EVALUATION Patient Name:  Harry H [facility]  Date of Exam:   10/14/2020 Medical Rec #: [id number]         Accession #:    [id number] Date of Birth: [birth year]         Patient Gender: M Patient Age:   69Y Exam Location:  [facility] Procedure:      VAS US DOPPLER PRE CABG Referring Phys: [id number] HARRELL O LIGHTFOOT --------------------------------------------------------------------------------  Indications:      Pre-CABG. Risk Factors:     Hypertension, hyperlipidemia. Comparison Study: No prior study Performing Technologist: Maudry Mayhew MHA, RDMS, RVT, RDCS  Examination Guidelines: A complete evaluation includes B-mode imaging, spectral Doppler, color Doppler, and power Doppler as needed of all accessible portions of each vessel. Bilateral testing is considered an integral part of a complete examination. Limited examinations for reoccurring indications may be performed as noted.  Right Carotid Findings:  +----------+--------+--------+--------+-------------------------+--------+           PSV cm/sEDV cm/sStenosisDescribe                 Comments +----------+--------+--------+--------+-------------------------+--------+ CCA Prox  60      12                                                +----------+--------+--------+--------+-------------------------+--------+ CCA Distal43      7               calcific                          +----------+--------+--------+--------+-------------------------+--------+ ICA Prox  50      8               heterogenous and calcific         +----------+--------+--------+--------+-------------------------+--------+ ICA Distal69      21                                                +----------+--------+--------+--------+-------------------------+--------+  ECA       63      5               heterogenous and calcific         +----------+--------+--------+--------+-------------------------+--------+ Portions of this table do not appear on this page. +----------+--------+-------+----------------+------------+           PSV cm/sEDV cmsDescribe        Arm Pressure +----------+--------+-------+----------------+------------+ Subclavian66             Multiphasic, WNL             +----------+--------+-------+----------------+------------+ +---------+--------+--+--------+--+---------+ VertebralPSV cm/s32EDV cm/s10Antegrade +---------+--------+--+--------+--+---------+ Left Carotid Findings: +----------+--------+--------+--------+--------------------------+--------+           PSV cm/sEDV cm/sStenosisDescribe                  Comments +----------+--------+--------+--------+--------------------------+--------+ CCA Prox  53      12                                                 +----------+--------+--------+--------+--------------------------+--------+ CCA Distal49      12              heterogenous and irregular          +----------+--------+--------+--------+--------------------------+--------+ ICA Prox  33      11                                                 +----------+--------+--------+--------+--------------------------+--------+ ICA Distal94      27                                                 +----------+--------+--------+--------+--------------------------+--------+ ECA       55      7                                                  +----------+--------+--------+--------+--------------------------+--------+ +----------+--------+--------+----------------+------------+ SubclavianPSV cm/sEDV cm/sDescribe        Arm Pressure +----------+--------+--------+----------------+------------+           139             Multiphasic, WNL             +----------+--------+--------+----------------+------------+ +---------+--------+--+--------+--+---------+ VertebralPSV cm/s49EDV cm/s14Antegrade +---------+--------+--+--------+--+---------+  ABI Findings: +--------+------------------+-----+---------+--------+ Right   Rt Pressure (mmHg)IndexWaveform Comment  +--------+------------------+-----+---------+--------+ [id number]                    triphasic         +--------+------------------+-----+---------+--------+ PTA     206               1.14 triphasic         +--------+------------------+-----+---------+--------+ DP      193               1.07 triphasic         +--------+------------------+-----+---------+--------+ +--------+------------------+-----+---------+-------+ Left    Lt Pressure (mmHg)IndexWaveform Comment +--------+------------------+-----+---------+-------+ [id number]  triphasic        +--------+------------------+-----+---------+-------+ PTA     214               1.19 triphasic        +--------+------------------+-----+---------+-------+ DP      212               1.18 triphasic         +--------+------------------+-----+---------+-------+ +-------+---------------+----------------+ ABI/TBIToday's ABI/TBIPrevious ABI/TBI +-------+---------------+----------------+ Right  1.14                            +-------+---------------+----------------+ Left   1.19                            +-------+---------------+----------------+  Right Doppler Findings: +--------+--------+-----+---------+--------+ Site    PressureIndexDoppler  Comments +--------+--------+-----+---------+--------+ [id number]          triphasic         +--------+--------+-----+---------+--------+ Radial               triphasic         +--------+--------+-----+---------+--------+ Ulnar                biphasic          +--------+--------+-----+---------+--------+  Left Doppler Findings: +--------+--------+-----+---------+--------+ Site    PressureIndexDoppler  Comments +--------+--------+-----+---------+--------+ Brachial180          triphasic         +--------+--------+-----+---------+--------+ Radial               triphasic         +--------+--------+-----+---------+--------+ Ulnar                biphasic          +--------+--------+-----+---------+--------+  Summary: Right Carotid: Velocities in the right ICA are consistent with a 1-39% stenosis. Left Carotid: Velocities in the left ICA are consistent with a 1-39% stenosis. Vertebrals:  Bilateral vertebral arteries demonstrate antegrade flow. Subclavians: Normal flow hemodynamics were seen in bilateral subclavian              arteries. Right ABI: Resting right ankle-brachial index is within normal range. No evidence of significant right lower extremity arterial disease. Left ABI: Resting left ankle-brachial index is within normal range. No evidence of significant left lower extremity arterial disease. Right Upper Extremity: Doppler waveform obliterate with right radial compression. Doppler waveforms remain within normal limits  with right ulnar compression. Left Upper Extremity: Doppler waveform obliterate with left radial compression. Doppler waveforms remain within normal limits with left ulnar compression.  Electronically signed by Ruta Hinds MD on 10/14/2020 at 7:49:39 PM.    Final     Cardiac Studies  Vasc US 10/14/2020 Summary:  Right Carotid: Velocities in the right ICA are consistent with a 1-39%  stenosis.   Left Carotid: Velocities in the left ICA are consistent with a 1-39%  stenosis.  Vertebrals:  Bilateral vertebral arteries demonstrate antegrade flow.  Subclavians: Normal flow hemodynamics were seen in bilateral subclavian               arteries.   Right ABI: Resting right ankle-brachial index is within normal range. No  evidence of significant right lower extremity arterial disease.  Left ABI: Resting left ankle-brachial index is within normal range. No  evidence of significant left lower extremity arterial disease.  Right Upper Extremity: Doppler waveform obliterate with right radial  compression. Doppler waveforms remain within normal limits  with right  ulnar compression.  Left Upper Extremity: Doppler waveform obliterate with left radial  compression. Doppler waveforms remain within normal limits with left ulnar  compression.     TTE 10/14/2020  1. Left ventricular ejection fraction, by estimation, is 60 to 65%. The  left ventricle has normal function. The left ventricle has no regional  wall motion abnormalities. Left ventricular diastolic parameters are  consistent with Grade II diastolic  dysfunction (pseudonormalization).   2. Right ventricular systolic function is normal. The right ventricular  size is normal.   3. The mitral valve is normal in structure. No evidence of mitral valve  regurgitation. No evidence of mitral stenosis.   4. The aortic valve is normal in structure. Aortic valve regurgitation is  not visualized. No aortic stenosis is present.   5. The inferior vena cava  is normal in size with greater than 50%  respiratory variability, suggesting right atrial pressure of 3 mmHg.    Patient Profile  Harry Olson is a 70 y.o. male with 3v CAD, HTN, HLD who was admitted 10/13/2020 with progressive angina/unstable angina.   Assessment & Plan   Unstable angina -Three-vessel CAD on heart cath from [facility].  Awaiting films. -Admitted on 10/13/2020 with unstable angina. -EKG without acute ischemic changes. -On aspirin.  On heparin drip.  No beta-blocker given sinus bradycardia.  On high intensity statin. -Sublingual nitro as needed. -Echo shows normal LV function without regional wall motion abnormalities.  No evidence of carotid artery disease.  CT surgery has tentatively plan for CABG tomorrow. -N.p.o. at midnight.  2.  History of stroke -Took Xarelto after a stroke several years ago.  He was not taking this in the outpatient setting.  3.  Hyperlipidemia -High intensity statin.  4.  Abnormal chest x-ray -There was concerns for a left upper lobe nodule on chest x-ray.  CT chest shows no evidence of nodule.  This was artifact.  5.  Depression/insomnia -Home medications  FEN -No intravenous fluids -Diet: Heart healthy -DVT PPx: Heparin drip -Code: Full  For questions or updates, please contact [facility] Please consult [email or web address] for contact info under   Time Spent with Patient: I have spent a total of 35 minutes with patient reviewing hospital notes, telemetry, EKGs, labs and examining the patient as well as establishing an assessment and plan that was discussed with the patient.  > 50% of time was spent in direct patient care.    Signed, Addison Naegeli. Audie Box, MD, [place]  10/15/2020 8:32 AM

## 2020-10-15 NOTE — Anesthesia Preprocedure Evaluation (Addendum)
Anesthesia Evaluation  Patient identified by MRN, date of birth, ID band Patient awake    Reviewed: Allergy & Precautions, NPO status , Patient's Chart, lab work & pertinent test results, reviewed documented beta blocker date and time   Airway Mallampati: II  TM Distance: >3 FB Neck ROM: Full    Dental no notable dental hx. (+) Dental Advisory Given   Pulmonary neg pulmonary ROS,    Pulmonary exam normal        Cardiovascular hypertension, + CAD  Normal cardiovascular exam  We do not currently have the images from his left heart catheterization, but based on the report he has severe three-vessel coronary disease.  Echocardiogram shows normal biventricular function and no significant valvular disease.   Neuro/Psych PSYCHIATRIC DISORDERS Anxiety Depression CVA    GI/Hepatic Neg liver ROS, GERD  ,  Endo/Other  negative endocrine ROS  Renal/GU negative Renal ROS     Musculoskeletal  (+) Arthritis ,   Abdominal   Peds  Hematology negative hematology ROS (+)   Anesthesia Other Findings   Reproductive/Obstetrics                            Anesthesia Physical Anesthesia Plan  ASA: 4  Anesthesia Plan: General   Post-op Pain Management:    Induction: Intravenous  PONV Risk Score and Plan: 3 and Ondansetron, Dexamethasone and Treatment may vary due to age or medical condition  Airway Management Planned: Oral ETT  Additional Equipment: 3D TEE, Ultrasound Guidance Line Placement, Arterial line and CVP  Intra-op Plan:   Post-operative Plan: Extubation in OR and Post-operative intubation/ventilation  Informed Consent: I have reviewed the patients History and Physical, chart, labs and discussed the procedure including the risks, benefits and alternatives for the proposed anesthesia with the patient or authorized representative who has indicated his/her understanding and acceptance.     Dental  advisory given  Plan Discussed with: Anesthesiologist, CRNA and Surgeon  Anesthesia Plan Comments:        Anesthesia Quick Evaluation

## 2020-10-15 NOTE — Progress Notes (Addendum)
ANTICOAGULATION CONSULT NOTE - Follow Up Consult  Pharmacy Consult for IV Heparin Indication: chest pain/ACS, CAD w/3 vessel disease   No Known Allergies  Patient Measurements: Height: 5\' 11"  (180.3 cm) Weight: 95.8 kg (211 lb 3.2 oz) IBW/kg (Calculated) : 75.3 Heparin Dosing Weight: 94.6 kg  Vital Signs: Temp: 97.7 F (36.5 C) (07/19 0729) Temp Source: Oral (07/19 0729) BP: 127/66 (07/19 0729) Pulse Rate: 54 (07/19 0729)  Labs: Recent Labs    10/13/20 1740 10/13/20 1740 10/13/20 1940 10/13/20 1953 10/14/20 0304 10/14/20 1209 10/14/20 1806 10/15/20 0034  HGB 14.1  --   --   --  12.9*  --   --  14.2  HCT 41.9  --   --   --  38.1*  --   --  42.0  PLT 195  --   --   --  172  --   --  198  LABPROT  --   --   --  13.3  --   --   --   --   INR  --   --   --  1.0  --   --   --   --   HEPARINUNFRC  --    < >  --   --  0.19* 0.60 0.34 0.32  CREATININE 1.24  --   --   --  1.23  --   --  1.21  TROPONINIHS 23*  --  25*  --   --   --   --   --    < > = values in this interval not displayed.     Estimated Creatinine Clearance: 67.1 mL/min (by C-G formula based on SCr of 1.21 mg/dL).   Assessment: 70 yr old man presented with chest pain. 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 Pt recently had LHC at [facility] showing 3-vessel disease. Pt is tentatively scheduled for CABG on 10/16/20 -heparin level at goal -CBC stable  Goal of Therapy:  Heparin level 0.3-0.7 units/ml Monitor platelets by anticoagulation protocol: Yes   Plan:  Continue heparin at 1550 units/hr Monitor daily heparin level, CBC  Hildred Laser, PharmD Clinical Pharmacist **Pharmacist phone directory can now be found on [email or web address] ([email or web address]).  Listed under [place].

## 2020-10-15 NOTE — Progress Notes (Signed)
Cath films are able to be viewed now under chart review, CV procedures and the one to view is the one from 10/14/20 under the echo - Dr. Kipp Brood was notified.

## 2020-10-15 NOTE — Progress Notes (Signed)
Harry Olson is OK to give all info

## 2020-10-16 ENCOUNTER — Inpatient Hospital Stay (HOSPITAL_COMMUNITY): Payer: Medicare Other | Admitting: Certified Registered Nurse Anesthetist

## 2020-10-16 ENCOUNTER — Inpatient Hospital Stay (HOSPITAL_COMMUNITY): Payer: Medicare Other

## 2020-10-16 ENCOUNTER — Inpatient Hospital Stay (HOSPITAL_COMMUNITY)
Admission: EM | Disposition: A | Discharge status: Home / Self Care | Payer: Self-pay | Source: Home / Self Care | Attending: Thoracic Surgery (Cardiothoracic Vascular Surgery)

## 2020-10-16 DIAGNOSIS — I200 Unstable angina: Secondary | ICD-10-CM | POA: Diagnosis not present

## 2020-10-16 DIAGNOSIS — I25110 Atherosclerotic heart disease of native coronary artery with unstable angina pectoris: Secondary | ICD-10-CM

## 2020-10-16 HISTORY — PX: CORONARY ARTERY BYPASS GRAFT: SHX141

## 2020-10-16 HISTORY — PX: TEE WITHOUT CARDIOVERSION: SHX5443

## 2020-10-16 HISTORY — PX: ENDOVEIN HARVEST OF GREATER SAPHENOUS VEIN: SHX5059

## 2020-10-16 LAB — POCT I-STAT 7, (LYTES, BLD GAS, ICA,H+H)
Acid-Base Excess: 0 mmol/L (ref 0.0–2.0)
Acid-Base Excess: 2 mmol/L (ref 0.0–2.0)
Acid-Base Excess: 1 mmol/L (ref 0.0–2.0)
Acid-base deficit: 1 mmol/L (ref 0.0–2.0)
Acid-base deficit: 2 mmol/L (ref 0.0–2.0)
Acid-base deficit: 6 mmol/L — ABNORMAL HIGH (ref 0.0–2.0)
Acid-base deficit: 6 mmol/L — ABNORMAL HIGH (ref 0.0–2.0)
Acid-base deficit: 3 mmol/L — ABNORMAL HIGH (ref 0.0–2.0)
Bicarbonate: 26.1 mmol/L (ref 20.0–28.0)
Bicarbonate: 27.8 mmol/L (ref 20.0–28.0)
Bicarbonate: 25.2 mmol/L (ref 20.0–28.0)
Bicarbonate: 24.6 mmol/L (ref 20.0–28.0)
Bicarbonate: 23 mmol/L (ref 20.0–28.0)
Bicarbonate: 19.5 mmol/L — ABNORMAL LOW (ref 20.0–28.0)
Bicarbonate: 20.4 mmol/L (ref 20.0–28.0)
Bicarbonate: 22.2 mmol/L (ref 20.0–28.0)
Calcium, Ion: 1.27 mmol/L (ref 1.15–1.40)
Calcium, Ion: 1.03 mmol/L — ABNORMAL LOW (ref 1.15–1.40)
Calcium, Ion: 1.07 mmol/L — ABNORMAL LOW (ref 1.15–1.40)
Calcium, Ion: 1.11 mmol/L — ABNORMAL LOW (ref 1.15–1.40)
Calcium, Ion: 1.33 mmol/L (ref 1.15–1.40)
Calcium, Ion: 1.17 mmol/L (ref 1.15–1.40)
Calcium, Ion: 1.2 mmol/L (ref 1.15–1.40)
Calcium, Ion: 1.16 mmol/L (ref 1.15–1.40)
HCT: 40 % (ref 39.0–52.0)
HCT: 29 % — ABNORMAL LOW (ref 39.0–52.0)
HCT: 28 % — ABNORMAL LOW (ref 39.0–52.0)
HCT: 29 % — ABNORMAL LOW (ref 39.0–52.0)
HCT: 30 % — ABNORMAL LOW (ref 39.0–52.0)
HCT: 32 % — ABNORMAL LOW (ref 39.0–52.0)
HCT: 34 % — ABNORMAL LOW (ref 39.0–52.0)
HCT: 34 % — ABNORMAL LOW (ref 39.0–52.0)
Hemoglobin: 13.6 g/dL (ref 13.0–17.0)
Hemoglobin: 9.9 g/dL — ABNORMAL LOW (ref 13.0–17.0)
Hemoglobin: 9.5 g/dL — ABNORMAL LOW (ref 13.0–17.0)
Hemoglobin: 9.9 g/dL — ABNORMAL LOW (ref 13.0–17.0)
Hemoglobin: 10.2 g/dL — ABNORMAL LOW (ref 13.0–17.0)
Hemoglobin: 10.9 g/dL — ABNORMAL LOW (ref 13.0–17.0)
Hemoglobin: 11.6 g/dL — ABNORMAL LOW (ref 13.0–17.0)
Hemoglobin: 11.6 g/dL — ABNORMAL LOW (ref 13.0–17.0)
O2 Saturation: 100 %
O2 Saturation: 100 %
O2 Saturation: 100 %
O2 Saturation: 100 %
O2 Saturation: 100 %
O2 Saturation: 98 %
O2 Saturation: 96 %
O2 Saturation: 93 %
Patient temperature: 37
Patient temperature: 37
Patient temperature: 37
Patient temperature: 37
Patient temperature: 36.8
Patient temperature: 37.3
Patient temperature: 38.3
Potassium: 3.8 mmol/L (ref 3.5–5.1)
Potassium: 3.8 mmol/L (ref 3.5–5.1)
Potassium: 4.3 mmol/L (ref 3.5–5.1)
Potassium: 4.4 mmol/L (ref 3.5–5.1)
Potassium: 4 mmol/L (ref 3.5–5.1)
Potassium: 3.5 mmol/L (ref 3.5–5.1)
Potassium: 4.7 mmol/L (ref 3.5–5.1)
Potassium: 4.1 mmol/L (ref 3.5–5.1)
Sodium: 142 mmol/L (ref 135–145)
Sodium: 141 mmol/L (ref 135–145)
Sodium: 139 mmol/L (ref 135–145)
Sodium: 140 mmol/L (ref 135–145)
Sodium: 142 mmol/L (ref 135–145)
Sodium: 144 mmol/L (ref 135–145)
Sodium: 142 mmol/L (ref 135–145)
Sodium: 143 mmol/L (ref 135–145)
TCO2: 28 mmol/L (ref 22–32)
TCO2: 29 mmol/L (ref 22–32)
TCO2: 26 mmol/L (ref 22–32)
TCO2: 26 mmol/L (ref 22–32)
TCO2: 24 mmol/L (ref 22–32)
TCO2: 21 mmol/L — ABNORMAL LOW (ref 22–32)
TCO2: 22 mmol/L (ref 22–32)
TCO2: 23 mmol/L (ref 22–32)
pCO2 arterial: 47.3 mmHg (ref 32.0–48.0)
pCO2 arterial: 47.3 mmHg (ref 32.0–48.0)
pCO2 arterial: 40 mmHg (ref 32.0–48.0)
pCO2 arterial: 41.6 mmHg (ref 32.0–48.0)
pCO2 arterial: 41.1 mmHg (ref 32.0–48.0)
pCO2 arterial: 39.4 mmHg (ref 32.0–48.0)
pCO2 arterial: 42.6 mmHg (ref 32.0–48.0)
pCO2 arterial: 43.1 mmHg (ref 32.0–48.0)
pH, Arterial: 7.35 (ref 7.350–7.450)
pH, Arterial: 7.378 (ref 7.350–7.450)
pH, Arterial: 7.408 (ref 7.350–7.450)
pH, Arterial: 7.381 (ref 7.350–7.450)
pH, Arterial: 7.355 (ref 7.350–7.450)
pH, Arterial: 7.302 — ABNORMAL LOW (ref 7.350–7.450)
pH, Arterial: 7.289 — ABNORMAL LOW (ref 7.350–7.450)
pH, Arterial: 7.326 — ABNORMAL LOW (ref 7.350–7.450)
pO2, Arterial: 308 mmHg — ABNORMAL HIGH (ref 83.0–108.0)
pO2, Arterial: 290 mmHg — ABNORMAL HIGH (ref 83.0–108.0)
pO2, Arterial: 285 mmHg — ABNORMAL HIGH (ref 83.0–108.0)
pO2, Arterial: 265 mmHg — ABNORMAL HIGH (ref 83.0–108.0)
pO2, Arterial: 189 mmHg — ABNORMAL HIGH (ref 83.0–108.0)
pO2, Arterial: 108 mmHg (ref 83.0–108.0)
pO2, Arterial: 97 mmHg (ref 83.0–108.0)
pO2, Arterial: 76 mmHg — ABNORMAL LOW (ref 83.0–108.0)

## 2020-10-16 LAB — POCT I-STAT EG7
Acid-Base Excess: 1 mmol/L (ref 0.0–2.0)
Bicarbonate: 26.6 mmol/L (ref 20.0–28.0)
Calcium, Ion: 1.07 mmol/L — ABNORMAL LOW (ref 1.15–1.40)
HCT: 30 % — ABNORMAL LOW (ref 39.0–52.0)
Hemoglobin: 10.2 g/dL — ABNORMAL LOW (ref 13.0–17.0)
O2 Saturation: 86 %
Patient temperature: 37
Potassium: 3.7 mmol/L (ref 3.5–5.1)
Sodium: 140 mmol/L (ref 135–145)
TCO2: 28 mmol/L (ref 22–32)
pCO2, Ven: 47.6 mmHg (ref 44.0–60.0)
pH, Ven: 7.355 (ref 7.250–7.430)
pO2, Ven: 54 mmHg — ABNORMAL HIGH (ref 32.0–45.0)

## 2020-10-16 LAB — COMPREHENSIVE METABOLIC PANEL
ALT: 25 U/L (ref 0–44)
AST: 36 U/L (ref 15–41)
Albumin: 2.9 g/dL — ABNORMAL LOW (ref 3.5–5.0)
Alkaline Phosphatase: 38 U/L (ref 38–126)
Anion gap: 9 (ref 5–15)
BUN: 12 mg/dL (ref 8–23)
CO2: 22 mmol/L (ref 22–32)
Calcium: 7.6 mg/dL — ABNORMAL LOW (ref 8.9–10.3)
Chloride: 106 mmol/L (ref 98–111)
Creatinine, Ser: 1.15 mg/dL (ref 0.61–1.24)
GFR, Estimated: >60 mL/min (ref >60)
Glucose, Bld: 189 mg/dL — ABNORMAL HIGH (ref 70–99)
Potassium: 4 mmol/L (ref 3.5–5.1)
Sodium: 137 mmol/L (ref 135–145)
Total Bilirubin: 1.6 mg/dL — ABNORMAL HIGH (ref 0.3–1.2)
Total Protein: 4.8 g/dL — ABNORMAL LOW (ref 6.5–8.1)

## 2020-10-16 LAB — POCT I-STAT, CHEM 8
BUN: 15 mg/dL (ref 8–23)
BUN: 14 mg/dL (ref 8–23)
BUN: 14 mg/dL (ref 8–23)
BUN: 13 mg/dL (ref 8–23)
BUN: 13 mg/dL (ref 8–23)
Calcium, Ion: 1.27 mmol/L (ref 1.15–1.40)
Calcium, Ion: 1.1 mmol/L — ABNORMAL LOW (ref 1.15–1.40)
Calcium, Ion: 1.24 mmol/L (ref 1.15–1.40)
Calcium, Ion: 1.13 mmol/L — ABNORMAL LOW (ref 1.15–1.40)
Calcium, Ion: 1.34 mmol/L (ref 1.15–1.40)
Chloride: 105 mmol/L (ref 98–111)
Chloride: 104 mmol/L (ref 98–111)
Chloride: 105 mmol/L (ref 98–111)
Chloride: 106 mmol/L (ref 98–111)
Chloride: 108 mmol/L (ref 98–111)
Creatinine, Ser: 1 mg/dL (ref 0.61–1.24)
Creatinine, Ser: 1 mg/dL (ref 0.61–1.24)
Creatinine, Ser: 1.1 mg/dL (ref 0.61–1.24)
Creatinine, Ser: 1 mg/dL (ref 0.61–1.24)
Creatinine, Ser: 0.9 mg/dL (ref 0.61–1.24)
Glucose, Bld: 126 mg/dL — ABNORMAL HIGH (ref 70–99)
Glucose, Bld: 144 mg/dL — ABNORMAL HIGH (ref 70–99)
Glucose, Bld: 151 mg/dL — ABNORMAL HIGH (ref 70–99)
Glucose, Bld: 200 mg/dL — ABNORMAL HIGH (ref 70–99)
Glucose, Bld: 190 mg/dL — ABNORMAL HIGH (ref 70–99)
HCT: 39 % (ref 39.0–52.0)
HCT: 27 % — ABNORMAL LOW (ref 39.0–52.0)
HCT: 38 % — ABNORMAL LOW (ref 39.0–52.0)
HCT: 29 % — ABNORMAL LOW (ref 39.0–52.0)
HCT: 25 % — ABNORMAL LOW (ref 39.0–52.0)
Hemoglobin: 13.3 g/dL (ref 13.0–17.0)
Hemoglobin: 12.9 g/dL — ABNORMAL LOW (ref 13.0–17.0)
Hemoglobin: 9.2 g/dL — ABNORMAL LOW (ref 13.0–17.0)
Hemoglobin: 9.9 g/dL — ABNORMAL LOW (ref 13.0–17.0)
Hemoglobin: 8.5 g/dL — ABNORMAL LOW (ref 13.0–17.0)
Potassium: 3.8 mmol/L (ref 3.5–5.1)
Potassium: 4 mmol/L (ref 3.5–5.1)
Potassium: 4.6 mmol/L (ref 3.5–5.1)
Potassium: 4.2 mmol/L (ref 3.5–5.1)
Potassium: 3.9 mmol/L (ref 3.5–5.1)
Sodium: 141 mmol/L (ref 135–145)
Sodium: 138 mmol/L (ref 135–145)
Sodium: 140 mmol/L (ref 135–145)
Sodium: 139 mmol/L (ref 135–145)
Sodium: 141 mmol/L (ref 135–145)
TCO2: 26 mmol/L (ref 22–32)
TCO2: 24 mmol/L (ref 22–32)
TCO2: 26 mmol/L (ref 22–32)
TCO2: 25 mmol/L (ref 22–32)
TCO2: 23 mmol/L (ref 22–32)

## 2020-10-16 LAB — CBC
HCT: 41.8 % (ref 39.0–52.0)
HCT: 36.7 % — ABNORMAL LOW (ref 39.0–52.0)
HCT: 36.4 % — ABNORMAL LOW (ref 39.0–52.0)
Hemoglobin: 14.1 g/dL (ref 13.0–17.0)
Hemoglobin: 12.4 g/dL — ABNORMAL LOW (ref 13.0–17.0)
Hemoglobin: 12.1 g/dL — ABNORMAL LOW (ref 13.0–17.0)
MCH: 29.9 pg (ref 26.0–34.0)
MCH: 30.7 pg (ref 26.0–34.0)
MCH: 30.3 pg (ref 26.0–34.0)
MCHC: 33.7 g/dL (ref 30.0–36.0)
MCHC: 33.8 g/dL (ref 30.0–36.0)
MCHC: 33.2 g/dL (ref 30.0–36.0)
MCV: 88.6 fL (ref 80.0–100.0)
MCV: 90.8 fL (ref 80.0–100.0)
MCV: 91 fL (ref 80.0–100.0)
Platelets: 195 10*3/uL (ref 150–400)
Platelets: 135 10*3/uL — ABNORMAL LOW (ref 150–400)
Platelets: 141 10*3/uL — ABNORMAL LOW (ref 150–400)
RBC: 4.72 MIL/uL (ref 4.22–5.81)
RBC: 4.04 MIL/uL — ABNORMAL LOW (ref 4.22–5.81)
RBC: 4 MIL/uL — ABNORMAL LOW (ref 4.22–5.81)
RDW: 12.4 % (ref 11.5–15.5)
RDW: 12.3 % (ref 11.5–15.5)
RDW: 12.4 % (ref 11.5–15.5)
WBC: 8.9 10*3/uL (ref 4.0–10.5)
WBC: 11.3 10*3/uL — ABNORMAL HIGH (ref 4.0–10.5)
WBC: 10.9 10*3/uL — ABNORMAL HIGH (ref 4.0–10.5)
nRBC: 0 % (ref 0.0–0.2)
nRBC: 0 % (ref 0.0–0.2)
nRBC: 0 % (ref 0.0–0.2)

## 2020-10-16 LAB — URINALYSIS, ROUTINE W REFLEX MICROSCOPIC
Bilirubin Urine: NEGATIVE
Glucose, UA: NEGATIVE mg/dL
Hgb urine dipstick: NEGATIVE
Ketones, ur: NEGATIVE mg/dL
Leukocytes,Ua: NEGATIVE
Nitrite: NEGATIVE
Protein, ur: NEGATIVE mg/dL
Specific Gravity, Urine: 1.027 (ref 1.005–1.030)
pH: 5 (ref 5.0–8.0)

## 2020-10-16 LAB — BASIC METABOLIC PANEL
Anion gap: 8 (ref 5–15)
BUN: 15 mg/dL (ref 8–23)
CO2: 25 mmol/L (ref 22–32)
Calcium: 9.2 mg/dL (ref 8.9–10.3)
Chloride: 104 mmol/L (ref 98–111)
Creatinine, Ser: 1.38 mg/dL — ABNORMAL HIGH (ref 0.61–1.24)
GFR, Estimated: 55 mL/min — ABNORMAL LOW (ref >60)
Glucose, Bld: 155 mg/dL — ABNORMAL HIGH (ref 70–99)
Potassium: 3.7 mmol/L (ref 3.5–5.1)
Sodium: 137 mmol/L (ref 135–145)

## 2020-10-16 LAB — GLUCOSE, CAPILLARY
Glucose-Capillary: 137 mg/dL — ABNORMAL HIGH (ref 70–99)
Glucose-Capillary: 139 mg/dL — ABNORMAL HIGH (ref 70–99)
Glucose-Capillary: 147 mg/dL — ABNORMAL HIGH (ref 70–99)
Glucose-Capillary: 102 mg/dL — ABNORMAL HIGH (ref 70–99)
Glucose-Capillary: 181 mg/dL — ABNORMAL HIGH (ref 70–99)
Glucose-Capillary: 196 mg/dL — ABNORMAL HIGH (ref 70–99)
Glucose-Capillary: 172 mg/dL — ABNORMAL HIGH (ref 70–99)
Glucose-Capillary: 62 mg/dL — ABNORMAL LOW (ref 70–99)
Glucose-Capillary: 201 mg/dL — ABNORMAL HIGH (ref 70–99)
Glucose-Capillary: 190 mg/dL — ABNORMAL HIGH (ref 70–99)

## 2020-10-16 LAB — ECHO INTRAOPERATIVE TEE
AV Mean grad: 3 mmHg
AV Peak grad: 5.8 mmHg
Ao pk vel: 1.2 m/s
Height: 71 in
Weight: 3350.99 oz

## 2020-10-16 LAB — MAGNESIUM: Magnesium: 2.6 mg/dL — ABNORMAL HIGH (ref 1.7–2.4)

## 2020-10-16 LAB — PROTIME-INR
INR: 1.3 — ABNORMAL HIGH (ref 0.8–1.2)
Prothrombin Time: 16 seconds — ABNORMAL HIGH (ref 11.4–15.2)

## 2020-10-16 LAB — PHOSPHORUS: Phosphorus: 4.1 mg/dL (ref 2.5–4.6)

## 2020-10-16 LAB — PLATELET COUNT: Platelets: 172 10*3/uL (ref 150–400)

## 2020-10-16 LAB — APTT: aPTT: 30 seconds (ref 24–36)

## 2020-10-16 LAB — HEPARIN LEVEL (UNFRACTIONATED): Heparin Unfractionated: 0.39 IU/mL (ref 0.30–0.70)

## 2020-10-16 LAB — HEMOGLOBIN AND HEMATOCRIT, BLOOD
HCT: 30.5 % — ABNORMAL LOW (ref 39.0–52.0)
Hemoglobin: 10.4 g/dL — ABNORMAL LOW (ref 13.0–17.0)

## 2020-10-16 SURGERY — CORONARY ARTERY BYPASS GRAFTING (CABG)
Anesthesia: General | Site: Chest | Laterality: Right

## 2020-10-16 MED ORDER — MIDAZOLAM HCL (PF) 10 MG/2ML IJ SOLN
INTRAMUSCULAR | Status: AC
  Filled 2020-10-16: qty 2

## 2020-10-16 MED ORDER — 0.9 % SODIUM CHLORIDE (POUR BTL) OPTIME
TOPICAL | Status: DC | PRN
  Administered 2020-10-16: 5000 mL

## 2020-10-16 MED ORDER — PLASMA-LYTE A IV SOLN
INTRAVENOUS | Status: DC | PRN
  Administered 2020-10-16: 1000 mL via INTRAVASCULAR

## 2020-10-16 MED ORDER — PHENYLEPHRINE HCL (PRESSORS) 10 MG/ML IV SOLN
INTRAVENOUS | Status: AC
  Filled 2020-10-16: qty 2

## 2020-10-16 MED ORDER — PROPOFOL 10 MG/ML IV BOLUS
INTRAVENOUS | Status: AC
  Filled 2020-10-16: qty 40

## 2020-10-16 MED ORDER — METOPROLOL TARTRATE 5 MG/5ML IV SOLN: 2.5000 mg | INTRAVENOUS | Status: DC | PRN

## 2020-10-16 MED ORDER — FENTANYL CITRATE (PF) 250 MCG/5ML IJ SOLN
INTRAMUSCULAR | Status: AC
  Filled 2020-10-16: qty 25

## 2020-10-16 MED ORDER — FAMOTIDINE IN NACL 20-0.9 MG/50ML-% IV SOLN
20.0000 mg | Freq: Two times a day (BID) | INTRAVENOUS | Status: AC
Start: 2020-10-16 — End: 2020-10-17
  Administered 2020-10-16: 20 mg via INTRAVENOUS
  Filled 2020-10-16: qty 50

## 2020-10-16 MED ORDER — CEFAZOLIN SODIUM-DEXTROSE 2-4 GM/100ML-% IV SOLN
2.0000 g | Freq: Three times a day (TID) | INTRAVENOUS | Status: AC
  Administered 2020-10-16 – 2020-10-18 (×6): 2 g via INTRAVENOUS
  Filled 2020-10-16 (×6): qty 100

## 2020-10-16 MED ORDER — CHLORHEXIDINE GLUCONATE 0.12% ORAL RINSE (MEDLINE KIT): 15.0000 mL | Freq: Two times a day (BID) | OROMUCOSAL | Status: DC

## 2020-10-16 MED ORDER — HEPARIN SODIUM (PORCINE) 1000 UNIT/ML IJ SOLN
INTRAMUSCULAR | Status: AC
  Filled 2020-10-16: qty 1

## 2020-10-16 MED ORDER — BISACODYL 5 MG PO TBEC
10.0000 mg | DELAYED_RELEASE_TABLET | Freq: Every day | ORAL | Status: DC
  Administered 2020-10-17 – 2020-10-21 (×5): 10 mg via ORAL
  Filled 2020-10-16 (×5): qty 2

## 2020-10-16 MED ORDER — HEPARIN SODIUM (PORCINE) 1000 UNIT/ML IJ SOLN
INTRAMUSCULAR | Status: DC | PRN
  Administered 2020-10-16: 33000 [IU] via INTRAVENOUS

## 2020-10-16 MED ORDER — CHLORHEXIDINE GLUCONATE CLOTH 2 % EX PADS
6.0000 | MEDICATED_PAD | Freq: Every day | CUTANEOUS | Status: DC
  Administered 2020-10-16 – 2020-10-20 (×5): 6 via TOPICAL

## 2020-10-16 MED ORDER — SODIUM CHLORIDE 0.9% FLUSH
10.0000 mL | INTRAVENOUS | Status: DC | PRN
  Administered 2020-10-16: 10 mL

## 2020-10-16 MED ORDER — PHENYLEPHRINE 40 MCG/ML (10ML) SYRINGE FOR IV PUSH (FOR BLOOD PRESSURE SUPPORT)
PREFILLED_SYRINGE | INTRAVENOUS | Status: DC | PRN
  Administered 2020-10-16: 80 ug via INTRAVENOUS
  Administered 2020-10-16: 40 ug via INTRAVENOUS

## 2020-10-16 MED ORDER — PROTAMINE SULFATE 10 MG/ML IV SOLN
INTRAVENOUS | Status: AC
  Filled 2020-10-16: qty 25

## 2020-10-16 MED ORDER — CHLORHEXIDINE GLUCONATE 0.12 % MT SOLN
15.0000 mL | OROMUCOSAL | Status: AC
  Administered 2020-10-16: 15 mL via OROMUCOSAL
  Filled 2020-10-16: qty 15

## 2020-10-16 MED ORDER — ASPIRIN EC 325 MG PO TBEC
325.0000 mg | DELAYED_RELEASE_TABLET | Freq: Every day | ORAL | Status: DC
  Administered 2020-10-17 – 2020-10-21 (×5): 325 mg via ORAL
  Filled 2020-10-16 (×6): qty 1

## 2020-10-16 MED ORDER — SODIUM BICARBONATE 8.4 % IV SOLN
100.0000 meq | Freq: Once | INTRAVENOUS | Status: AC
  Administered 2020-10-16: 100 meq via INTRAVENOUS

## 2020-10-16 MED ORDER — PROTAMINE SULFATE 10 MG/ML IV SOLN
INTRAVENOUS | Status: DC | PRN
  Administered 2020-10-16: 330 mg via INTRAVENOUS

## 2020-10-16 MED ORDER — ASPIRIN 81 MG PO CHEW: 324.0000 mg | CHEWABLE_TABLET | Freq: Every day | ORAL | Status: DC

## 2020-10-16 MED ORDER — LACTATED RINGERS IV SOLN: 500.0000 mL | Freq: Once | INTRAVENOUS | Status: DC | PRN

## 2020-10-16 MED ORDER — ACETAMINOPHEN 160 MG/5ML PO SOLN: 650.0000 mg | Freq: Once | ORAL | Status: AC

## 2020-10-16 MED ORDER — CHLORHEXIDINE GLUCONATE CLOTH 2 % EX PADS
6.0000 | MEDICATED_PAD | Freq: Every day | CUTANEOUS | Status: DC
  Administered 2020-10-17: 6 via TOPICAL

## 2020-10-16 MED ORDER — SODIUM CHLORIDE 0.9% FLUSH
3.0000 mL | Freq: Two times a day (BID) | INTRAVENOUS | Status: DC
  Administered 2020-10-17 (×2): 3 mL via INTRAVENOUS

## 2020-10-16 MED ORDER — MORPHINE SULFATE (PF) 2 MG/ML IV SOLN
1.0000 mg | INTRAVENOUS | Status: DC | PRN
  Administered 2020-10-16 – 2020-10-17 (×3): 2 mg via INTRAVENOUS
  Administered 2020-10-17 – 2020-10-18 (×3): 3 mg via INTRAVENOUS
  Administered 2020-10-19: 2 mg via INTRAVENOUS
  Filled 2020-10-16: qty 2
  Filled 2020-10-16: qty 1
  Filled 2020-10-16 (×2): qty 2
  Filled 2020-10-16 (×3): qty 1

## 2020-10-16 MED ORDER — ALBUMIN HUMAN 5 % IV SOLN: INTRAVENOUS | Status: DC | PRN

## 2020-10-16 MED ORDER — ONDANSETRON HCL 4 MG/2ML IJ SOLN: 4.0000 mg | Freq: Four times a day (QID) | INTRAMUSCULAR | Status: DC | PRN

## 2020-10-16 MED ORDER — LACTATED RINGERS IV SOLN: INTRAVENOUS | Status: DC | PRN

## 2020-10-16 MED ORDER — PHENYLEPHRINE HCL-NACL 20-0.9 MG/250ML-% IV SOLN: 0.0000 ug/min | INTRAVENOUS | Status: DC

## 2020-10-16 MED ORDER — METOCLOPRAMIDE HCL 5 MG/ML IJ SOLN
10.0000 mg | Freq: Four times a day (QID) | INTRAMUSCULAR | Status: AC
  Administered 2020-10-16 – 2020-10-17 (×4): 10 mg via INTRAVENOUS
  Filled 2020-10-16 (×4): qty 2

## 2020-10-16 MED ORDER — ALBUMIN HUMAN 5 % IV SOLN
250.0000 mL | INTRAVENOUS | Status: AC | PRN
  Administered 2020-10-16 – 2020-10-17 (×2): 12.5 g via INTRAVENOUS

## 2020-10-16 MED ORDER — OXYCODONE HCL 5 MG PO TABS
5.0000 mg | ORAL_TABLET | ORAL | Status: DC | PRN
  Administered 2020-10-16 – 2020-10-20 (×12): 10 mg via ORAL
  Filled 2020-10-16: qty 1
  Filled 2020-10-16: qty 2
  Filled 2020-10-16: qty 1
  Filled 2020-10-16 (×8): qty 2
  Filled 2020-10-16: qty 1
  Filled 2020-10-16 (×2): qty 2

## 2020-10-16 MED ORDER — MAGNESIUM SULFATE 4 GM/100ML IV SOLN
4.0000 g | Freq: Once | INTRAVENOUS | Status: AC
  Administered 2020-10-16: 4 g via INTRAVENOUS
  Filled 2020-10-16: qty 100

## 2020-10-16 MED ORDER — SODIUM CHLORIDE 0.9% FLUSH: 3.0000 mL | INTRAVENOUS | Status: DC | PRN

## 2020-10-16 MED ORDER — SODIUM CHLORIDE 0.45 % IV SOLN: INTRAVENOUS | Status: DC | PRN

## 2020-10-16 MED ORDER — TRAMADOL HCL 50 MG PO TABS
50.0000 mg | ORAL_TABLET | ORAL | Status: DC | PRN
  Administered 2020-10-16 – 2020-10-20 (×4): 100 mg via ORAL
  Filled 2020-10-16 (×4): qty 2

## 2020-10-16 MED ORDER — MIDAZOLAM HCL 2 MG/2ML IJ SOLN: 2.0000 mg | INTRAMUSCULAR | Status: DC | PRN

## 2020-10-16 MED ORDER — HEMOSTATIC AGENTS (NO CHARGE) OPTIME
TOPICAL | Status: DC | PRN
  Administered 2020-10-16: 1 via TOPICAL

## 2020-10-16 MED ORDER — METOPROLOL TARTRATE 25 MG/10 ML ORAL SUSPENSION: 12.5000 mg | Freq: Two times a day (BID) | ORAL | Status: DC

## 2020-10-16 MED ORDER — INSULIN REGULAR(HUMAN) IN NACL 100-0.9 UT/100ML-% IV SOLN
INTRAVENOUS | Status: DC
  Filled 2020-10-16: qty 100

## 2020-10-16 MED ORDER — DEXMEDETOMIDINE HCL IN NACL 400 MCG/100ML IV SOLN
0.0000 ug/kg/h | INTRAVENOUS | Status: DC
  Filled 2020-10-16 (×2): qty 100

## 2020-10-16 MED ORDER — EPHEDRINE SULFATE-NACL 50-0.9 MG/10ML-% IV SOSY
PREFILLED_SYRINGE | INTRAVENOUS | Status: DC | PRN
  Administered 2020-10-16: 5 mg via INTRAVENOUS

## 2020-10-16 MED ORDER — ORAL CARE MOUTH RINSE: 15.0000 mL | OROMUCOSAL | Status: DC

## 2020-10-16 MED ORDER — METOPROLOL TARTRATE 12.5 MG HALF TABLET: 12.5000 mg | ORAL_TABLET | Freq: Two times a day (BID) | ORAL | Status: DC

## 2020-10-16 MED ORDER — DEXTROSE 50 % IV SOLN: 0.0000 mL | INTRAVENOUS | Status: DC | PRN

## 2020-10-16 MED ORDER — MIDAZOLAM HCL (PF) 5 MG/ML IJ SOLN
INTRAMUSCULAR | Status: DC | PRN
  Administered 2020-10-16: 4 mg via INTRAVENOUS
  Administered 2020-10-16: 3 mg via INTRAVENOUS
  Administered 2020-10-16: 1 mg via INTRAVENOUS
  Administered 2020-10-16: 2 mg via INTRAVENOUS

## 2020-10-16 MED ORDER — SODIUM CHLORIDE 0.9 % IV SOLN
INTRAVENOUS | Status: DC | PRN
Start: 2020-10-16 — End: 2020-10-16

## 2020-10-16 MED ORDER — POTASSIUM CHLORIDE 10 MEQ/50ML IV SOLN
10.0000 meq | INTRAVENOUS | Status: AC
  Administered 2020-10-16 (×3): 10 meq via INTRAVENOUS

## 2020-10-16 MED ORDER — ACETAMINOPHEN 500 MG PO TABS
1000.0000 mg | ORAL_TABLET | Freq: Four times a day (QID) | ORAL | Status: DC
  Administered 2020-10-17 – 2020-10-20 (×14): 1000 mg via ORAL
  Filled 2020-10-16 (×15): qty 2

## 2020-10-16 MED ORDER — PROTAMINE SULFATE 10 MG/ML IV SOLN
INTRAVENOUS | Status: AC
  Filled 2020-10-16: qty 5

## 2020-10-16 MED ORDER — PROPOFOL 10 MG/ML IV BOLUS
INTRAVENOUS | Status: DC | PRN
  Administered 2020-10-16: 180 mg via INTRAVENOUS

## 2020-10-16 MED ORDER — ACETAMINOPHEN 650 MG RE SUPP
650.0000 mg | Freq: Once | RECTAL | Status: AC
  Administered 2020-10-16: 650 mg via RECTAL

## 2020-10-16 MED ORDER — PHENYLEPHRINE 40 MCG/ML (10ML) SYRINGE FOR IV PUSH (FOR BLOOD PRESSURE SUPPORT)
PREFILLED_SYRINGE | INTRAVENOUS | Status: AC
  Filled 2020-10-16: qty 20

## 2020-10-16 MED ORDER — ROCURONIUM BROMIDE 10 MG/ML (PF) SYRINGE
PREFILLED_SYRINGE | INTRAVENOUS | Status: DC | PRN
  Administered 2020-10-16: 20 mg via INTRAVENOUS
  Administered 2020-10-16: 40 mg via INTRAVENOUS
  Administered 2020-10-16: 100 mg via INTRAVENOUS
  Administered 2020-10-16: 60 mg via INTRAVENOUS
  Administered 2020-10-16: 50 mg via INTRAVENOUS

## 2020-10-16 MED ORDER — BISACODYL 10 MG RE SUPP: 10.0000 mg | Freq: Every day | RECTAL | Status: DC

## 2020-10-16 MED ORDER — PHENYLEPHRINE 40 MCG/ML (10ML) SYRINGE FOR IV PUSH (FOR BLOOD PRESSURE SUPPORT)
PREFILLED_SYRINGE | INTRAVENOUS | Status: AC
  Filled 2020-10-16: qty 10

## 2020-10-16 MED ORDER — SODIUM CHLORIDE (PF) 0.9 % IJ SOLN
OROMUCOSAL | Status: DC | PRN
  Administered 2020-10-16 (×2): 4 mL via TOPICAL

## 2020-10-16 MED ORDER — SODIUM CHLORIDE 0.9 % IV SOLN: 250.0000 mL | INTRAVENOUS | Status: DC

## 2020-10-16 MED ORDER — FENTANYL CITRATE (PF) 250 MCG/5ML IJ SOLN
INTRAMUSCULAR | Status: DC | PRN
  Administered 2020-10-16: 150 ug via INTRAVENOUS
  Administered 2020-10-16: 100 ug via INTRAVENOUS
  Administered 2020-10-16: 250 ug via INTRAVENOUS
  Administered 2020-10-16: 100 ug via INTRAVENOUS
  Administered 2020-10-16: 150 ug via INTRAVENOUS
  Administered 2020-10-16: 100 ug via INTRAVENOUS

## 2020-10-16 MED ORDER — DOBUTAMINE IN D5W 4-5 MG/ML-% IV SOLN: 2.5000 ug/kg/min | INTRAVENOUS | Status: DC

## 2020-10-16 MED ORDER — SODIUM CHLORIDE 0.9 % IV SOLN: INTRAVENOUS | Status: DC

## 2020-10-16 MED ORDER — NITROGLYCERIN IN D5W 200-5 MCG/ML-% IV SOLN: 0.0000 ug/min | INTRAVENOUS | Status: DC

## 2020-10-16 MED ORDER — VANCOMYCIN HCL IN DEXTROSE 1-5 GM/200ML-% IV SOLN
1000.0000 mg | Freq: Once | INTRAVENOUS | Status: AC
  Administered 2020-10-16: 1000 mg via INTRAVENOUS
  Filled 2020-10-16: qty 200

## 2020-10-16 MED ORDER — ORAL CARE MOUTH RINSE
15.0000 mL | Freq: Two times a day (BID) | OROMUCOSAL | Status: DC
  Administered 2020-10-16 – 2020-10-21 (×6): 15 mL via OROMUCOSAL

## 2020-10-16 MED ORDER — DOCUSATE SODIUM 100 MG PO CAPS
200.0000 mg | ORAL_CAPSULE | Freq: Every day | ORAL | Status: DC
  Administered 2020-10-17 – 2020-10-21 (×5): 200 mg via ORAL
  Filled 2020-10-16 (×5): qty 2

## 2020-10-16 MED ORDER — PANTOPRAZOLE SODIUM 40 MG PO TBEC
40.0000 mg | DELAYED_RELEASE_TABLET | Freq: Every day | ORAL | Status: DC
  Administered 2020-10-18 – 2020-10-21 (×4): 40 mg via ORAL
  Filled 2020-10-16 (×4): qty 1

## 2020-10-16 MED ORDER — LACTATED RINGERS IV SOLN: INTRAVENOUS | Status: DC

## 2020-10-16 MED ORDER — LACTATED RINGERS IV SOLN
INTRAVENOUS | Status: DC
  Administered 2020-10-18: 10 mL/h via INTRAVENOUS

## 2020-10-16 MED ORDER — ACETAMINOPHEN 160 MG/5ML PO SOLN: 1000.0000 mg | Freq: Four times a day (QID) | ORAL | Status: DC

## 2020-10-16 SURGICAL SUPPLY — 93 items
ADH SKN CLS APL DERMABOND .7 (GAUZE/BANDAGES/DRESSINGS) ×4
BAG DECANTER FOR FLEXI CONT (MISCELLANEOUS) ×5 IMPLANT
BLADE CLIPPER SURG (BLADE) ×5 IMPLANT
BLADE STERNUM SYSTEM 6 (BLADE) ×5 IMPLANT
BLADE SURG 11 STRL SS (BLADE) ×1 IMPLANT
BNDG ELASTIC 4X5.8 VLCR STR LF (GAUZE/BANDAGES/DRESSINGS) ×5 IMPLANT
BNDG ELASTIC 6X5.8 VLCR STR LF (GAUZE/BANDAGES/DRESSINGS) ×5 IMPLANT
BNDG GAUZE ELAST 4 BULKY (GAUZE/BANDAGES/DRESSINGS) ×5 IMPLANT
CABLE SURGICAL S-101-97-12 (CABLE) ×5 IMPLANT
CANISTER SUCT 3000ML PPV (MISCELLANEOUS) ×5 IMPLANT
CANNULA MC2 2 STG 29/37 NON-V (CANNULA) ×4 IMPLANT
CANNULA MC2 TWO STAGE (CANNULA) ×5
CANNULA NON VENT 20FR 12 (CANNULA) ×5 IMPLANT
CATH ROBINSON RED A/P 18FR (CATHETERS) ×10 IMPLANT
CLIP RETRACTION 3.0MM CORONARY (MISCELLANEOUS) ×5 IMPLANT
CLIP VESOCCLUDE MED 24/CT (CLIP) IMPLANT
CLIP VESOCCLUDE SM WIDE 24/CT (CLIP) ×3 IMPLANT
CONN ST 1/2X1/2  BEN (MISCELLANEOUS) ×5
CONN ST 1/2X1/2 BEN (MISCELLANEOUS) ×4 IMPLANT
CONNECTOR BLAKE 2:1 CARIO BLK (MISCELLANEOUS) ×5 IMPLANT
CONTAINER PROTECT SURGISLUSH (MISCELLANEOUS) ×5 IMPLANT
DERMABOND ADVANCED (GAUZE/BANDAGES/DRESSINGS) ×1
DERMABOND ADVANCED .7 DNX12 (GAUZE/BANDAGES/DRESSINGS) IMPLANT
DRAIN CHANNEL 19F RND (DRAIN) ×14 IMPLANT
DRAIN CONNECTOR BLAKE 1:1 (MISCELLANEOUS) ×5 IMPLANT
DRAPE CARDIOVASCULAR INCISE (DRAPES) ×5
DRAPE INCISE IOBAN 66X45 STRL (DRAPES) IMPLANT
DRAPE SRG 135X102X78XABS (DRAPES) ×4 IMPLANT
DRAPE WARM FLUID 44X44 (DRAPES) ×5 IMPLANT
DRSG AQUACEL AG ADV 3.5X10 (GAUZE/BANDAGES/DRESSINGS) ×5 IMPLANT
DRSG AQUACEL AG ADV 3.5X14 (GAUZE/BANDAGES/DRESSINGS) ×1 IMPLANT
DRSG COVADERM 4X14 (GAUZE/BANDAGES/DRESSINGS) ×5 IMPLANT
ELECT BLADE 4.0 EZ CLEAN MEGAD (MISCELLANEOUS) ×5
ELECT REM PT RETURN 9FT ADLT (ELECTROSURGICAL) ×10
ELECTRODE BLDE 4.0 EZ CLN MEGD (MISCELLANEOUS) ×4 IMPLANT
ELECTRODE REM PT RTRN 9FT ADLT (ELECTROSURGICAL) ×8 IMPLANT
FELT TEFLON 1X6 (MISCELLANEOUS) ×9 IMPLANT
GAUZE 4X4 16PLY ~~LOC~~+RFID DBL (SPONGE) ×1 IMPLANT
GAUZE SPONGE 4X4 12PLY STRL (GAUZE/BANDAGES/DRESSINGS) ×10 IMPLANT
GAUZE SPONGE 4X4 12PLY STRL LF (GAUZE/BANDAGES/DRESSINGS) ×2 IMPLANT
GLOVE SURG ENC MOIS LTX SZ7 (GLOVE) ×10 IMPLANT
GLOVE SURG ENC TEXT LTX SZ7.5 (GLOVE) ×10 IMPLANT
GLOVE SURG MICRO LTX SZ6 (GLOVE) ×1 IMPLANT
GLOVE SURG MICRO LTX SZ6.5 (GLOVE) ×1 IMPLANT
GLOVE SURG POLYISO LF SZ6 (GLOVE) ×1 IMPLANT
GLOVE SURG UNDER POLY LF SZ6 (GLOVE) ×1 IMPLANT
GOWN STRL REUS W/ TWL LRG LVL3 (GOWN DISPOSABLE) ×16 IMPLANT
GOWN STRL REUS W/ TWL XL LVL3 (GOWN DISPOSABLE) ×8 IMPLANT
GOWN STRL REUS W/TWL LRG LVL3 (GOWN DISPOSABLE) ×50
GOWN STRL REUS W/TWL XL LVL3 (GOWN DISPOSABLE) ×10
HEMOSTAT POWDER SURGIFOAM 1G (HEMOSTASIS) ×15 IMPLANT
INSERT SUTURE HOLDER (MISCELLANEOUS) ×5 IMPLANT
KIT BASIN OR (CUSTOM PROCEDURE TRAY) ×5 IMPLANT
KIT SUCTION CATH 14FR (SUCTIONS) ×5 IMPLANT
KIT TURNOVER KIT B (KITS) ×5 IMPLANT
KIT VASOVIEW HEMOPRO 2 VH 4000 (KITS) ×5 IMPLANT
LEAD PACING MYOCARDI (MISCELLANEOUS) ×5 IMPLANT
MARKER GRAFT CORONARY BYPASS (MISCELLANEOUS) ×3 IMPLANT
NS IRRIG 1000ML POUR BTL (IV SOLUTION) ×25 IMPLANT
PACK ACCESSORY CANNULA KIT (KITS) ×5 IMPLANT
PACK E OPEN HEART (SUTURE) ×5 IMPLANT
PACK OPEN HEART (CUSTOM PROCEDURE TRAY) ×5 IMPLANT
PAD ARMBOARD 7.5X6 YLW CONV (MISCELLANEOUS) ×10 IMPLANT
PAD ELECT DEFIB RADIOL ZOLL (MISCELLANEOUS) ×5 IMPLANT
PENCIL BUTTON HOLSTER BLD 10FT (ELECTRODE) ×5 IMPLANT
POSITIONER HEAD DONUT 9IN (MISCELLANEOUS) ×5 IMPLANT
PUNCH AORTIC ROTATE 4.0MM (MISCELLANEOUS) ×5 IMPLANT
SEALANT SURG COSEAL 8ML (VASCULAR PRODUCTS) ×1 IMPLANT
SET MPS 3-ND DEL (MISCELLANEOUS) ×1 IMPLANT
SOL ANTI FOG 6CC (MISCELLANEOUS) IMPLANT
SOLUTION ANTI FOG 6CC (MISCELLANEOUS) ×1
SPONGE T-LAP 18X18 ~~LOC~~+RFID (SPONGE) ×4 IMPLANT
SPONGE T-LAP 4X18 ~~LOC~~+RFID (SPONGE) ×1 IMPLANT
SUPPORT HEART JANKE-BARRON (MISCELLANEOUS) ×5 IMPLANT
SUT BONE WAX W31G (SUTURE) ×5 IMPLANT
SUT ETHIBOND X763 2 0 SH 1 (SUTURE) ×10 IMPLANT
SUT MNCRL AB 3-0 PS2 18 (SUTURE) ×10 IMPLANT
SUT MNCRL AB 4-0 PS2 18 (SUTURE) ×1 IMPLANT
SUT PDS AB 1 CTX 36 (SUTURE) ×10 IMPLANT
SUT PROLENE 4 0 SH DA (SUTURE) ×9 IMPLANT
SUT PROLENE 5 0 C 1 36 (SUTURE) ×17 IMPLANT
SUT PROLENE 7 0 BV1 MDA (SUTURE) ×7 IMPLANT
SUT STEEL 6MS V (SUTURE) ×10 IMPLANT
SUT VIC AB 2-0 CT1 27 (SUTURE) ×5
SUT VIC AB 2-0 CT1 TAPERPNT 27 (SUTURE) IMPLANT
SYSTEM SAHARA CHEST DRAIN ATS (WOUND CARE) ×5 IMPLANT
TAPE CLOTH SURG 4X10 WHT LF (GAUZE/BANDAGES/DRESSINGS) ×2 IMPLANT
TOWEL GREEN STERILE (TOWEL DISPOSABLE) ×5 IMPLANT
TOWEL GREEN STERILE FF (TOWEL DISPOSABLE) ×5 IMPLANT
TRAY FOLEY SLVR 16FR TEMP STAT (SET/KITS/TRAYS/PACK) ×5 IMPLANT
TUBING LAP HI FLOW INSUFFLATIO (TUBING) ×5 IMPLANT
UNDERPAD 30X36 HEAVY ABSORB (UNDERPADS AND DIAPERS) ×5 IMPLANT
WATER STERILE IRR 1000ML POUR (IV SOLUTION) ×10 IMPLANT

## 2020-10-16 NOTE — Anesthesia Procedure Notes (Signed)
Arterial Line Insertion Start/End7/20/2022 7:05 AM, 10/16/2020 7:09 AM Performed by: Duane Boston, MD, Valda Favia, CRNA  Patient location: Pre-op. Preanesthetic checklist: patient identified, IV checked, site marked, risks and benefits discussed, surgical consent, monitors and equipment checked, pre-op evaluation, timeout performed and anesthesia consent Lidocaine 1% used for infiltration Left, radial was placed Catheter size: 20 G Hand hygiene performed  and maximum sterile barriers used   Attempts: 1 Procedure performed without using ultrasound guided technique. Following insertion, dressing applied and Biopatch. Post procedure assessment: normal and unchanged  Patient tolerated the procedure well with no immediate complications.

## 2020-10-16 NOTE — Anesthesia Procedure Notes (Signed)
Arterial Line Insertion Start/End7/20/2022 8:46 AM, 10/16/2020 8:56 AM Performed by: Duane Boston, MD  Patient location: Pre-op. Preanesthetic checklist: patient identified, IV checked, site marked, risks and benefits discussed, surgical consent, monitors and equipment checked, pre-op evaluation, timeout performed and anesthesia consent Lidocaine 1% used for infiltration Left, brachial was placed Catheter size: 20 Fr Hand hygiene performed  and maximum sterile barriers used   Attempts: 1 Procedure performed using ultrasound guided technique. Ultrasound Notes:anatomy identified, no ultrasound evidence of intravascular and/or intraneural injection and image(s) printed for medical record Following insertion, dressing applied and Biopatch. Post procedure assessment: normal and unchanged  Post procedure complications: second provider assisted. Patient tolerated the procedure well with no immediate complications.

## 2020-10-16 NOTE — Op Note (Signed)
[address]       [place] [address]             [phone]                                          10/16/2020 Patient:  Harry Olson Pre-Op Dx: Three-vessel coronary artery disease.   Hypertension. Post-op Dx: Same Procedure: CABG X 4, LIMA LAD, reverse saphenous vein graft to posterior lateral branch (Y graft), OM1, ramus intermedius, Endoscopic greater saphenous vein harvest on the left   Surgeon and Role:      * Florella Mcneese, Lucile Crater, MD - Primary    *E. Barrett, PA-C- assisting  Anesthesia  general EBL: 1 L Blood Administration: None Xclamp Time: 102 min Pump Time: 152 min  Drains: 19 F blake drain: L, mediastinal  Wires: None Counts: correct   Indications: This is a 70 year old-year-old gentleman who has a history of coronary artery disease and worsening anginal symptoms was admitted for surgical evaluation.  He does have a history of cerebrovascular event in the past.  He is also on Xarelto.  We do not currently have the images from his left heart catheterization, but based on the report he has severe three-vessel coronary disease.  Echocardiogram shows normal biventricular function and no significant valvular disease.  He is agreeable to proceed with surgery.   Findings: Good LIMA, good vein conduit.  The PDA was quite small.  Retracted back to the distal RCA but this was also heavily calcified.  We attempted to perform an anastomosis here with the vein graft flow was not adequate.  The site was then tied off.  We then found a decent vessel in the posterior lateral branch but this too was small.  The vein graft flows are much better.  The ramus, obtuse marginal, and LAD were all good-quality vessels.  Operative Technique: All invasive lines were placed in pre-op holding.  After the risks, benefits and alternatives were thoroughly discussed, the patient was brought to the operative theatre.  Anesthesia was induced, and the patient was  prepped and draped in normal sterile fashion.  An appropriate surgical pause was performed, and pre-operative antibiotics were dosed accordingly.  We began with simultaneous incisions along the left leg for harvesting of the greater saphenous vein and the chest for the sternotomy.  In regards to the sternotomy, this was carried down with bovie cautery, and the sternum was divided with a reciprocating saw.  Meticulous hemostasis was obtained.  The left internal thoracic artery was exposed and harvested in in pedicled fashion.  The patient was systemically heparinized, and the artery was divided distally, and placed in a papaverine sponge.    The sternal elevator was removed, and a retractor was placed.  The pericardium was divided in the midline and fashioned into a cradle with pericardial stitches.   After we confirmed an appropriate ACT, the ascending aorta was cannulated in standard fashion.  The right atrial appendage was used for venous cannulation site.  Cardiopulmonary bypass was initiated, and the heart retractor was placed. The cross clamp was applied, and a dose of anterograde cardioplegia was given with good arrest of the heart.  We moved to the posterior wall of the heart, and found a good target on the posterior lateral branch.  An arteriotomy was made, and the vein graft was anastomosed  to it in an end to side fashion.  Next we exposed the lateral wall, and found a good target on the OM1.  An end to side anastomosis with the vein graft was then created.  Next, we exposed the anterior anterior lateral wall of the heart and identified a good target on ramus intermedius.   An arteriotomy was created.  The vein was anastomosed in an end to side fashion.  Finally, we exposed a good target on the LAD, and fashioned an end to side anastomosis between it and the LITA.  We began to re-warm, and a re-animation dose of cardioplegia was given.  The heart was de-aired, and the cross clamp was removed.   Meticulous hemostasis was obtained.    A partial occludding clamp was then placed on the ascending aorta, and we created an end to side anastomosis between it and the proximal vein grafts.  The vein graft to the posterior lateral branch was jumped off of the hood of the ramus intermedius branch.  The proximal sites were marked with rings.  Hemostasis was obtained, and we separated from cardiopulmonary bypass without event.  The heparin was reversed with protamine.  Chest tubes and wires were placed, and the sternum was re-approximated with sternal wires.  The soft tissue and skin were re-approximated wth absorbable suture.    The patient tolerated the procedure without any immediate complications, and was transferred to the ICU in guarded condition.  Harry Olson Bary Leriche

## 2020-10-16 NOTE — Progress Notes (Signed)
     [address]       [place] [address]             [phone]       No events  Vitals:   10/15/20 2337 10/16/20 0436  BP: 136/67 124/75  Pulse: 79 64  Resp: 18 17  Temp: 98.9 F (37.2 C) 97.8 F (36.6 C)  SpO2: 92% 93%   Alert NAD Sinus EWOB  OR today for CABG [place]

## 2020-10-16 NOTE — Plan of Care (Signed)
  Problem: Clinical Measurements: Goal: Ability to maintain clinical measurements within normal limits will improve Outcome: Not Progressing   Problem: Nutrition: Goal: Adequate nutrition will be maintained Outcome: Not Progressing   Problem: Coping: Goal: Level of anxiety will decrease Outcome: Not Progressing

## 2020-10-16 NOTE — Transfer of Care (Signed)
Immediate Anesthesia Transfer of Care Note  Patient: Harry Olson  Procedure(s) Performed: CORONARY ARTERY BYPASS GRAFTING (CABG) X  FOUR  ON PUMP USING LEFT INTERNAL MAMMARY ARTERY AND RIGHT ENDOSCOPIC GREATER SAPHENOUS VEIN CONDUITS. (Chest) TRANSESOPHAGEAL ECHOCARDIOGRAM (TEE) APPLICATION OF CELL SAVER ENDOVEIN HARVEST OF GREATER SAPHENOUS VEIN (Right)  Patient Location: ICU  Anesthesia Type:General  Level of Consciousness: Patient remains intubated per anesthesia plan  Airway & Oxygen Therapy: Patient remains intubated per anesthesia plan  Post-op Assessment: Report given to RN and Post -op Vital signs reviewed and stable  Post vital signs: stable  Last Vitals:  Vitals Value Taken Time  BP    Temp    Pulse    Resp    SpO2      Last Pain:  Vitals:   10/16/20 0720  TempSrc:   PainSc: 5       Patients Stated Pain Goal: 0 (96/04/54 0981)  Complications: No notable events documented.

## 2020-10-16 NOTE — Brief Op Note (Signed)
10/13/2020 - 10/16/2020  8:07 AM  PATIENT:  Harry Olson  70 y.o. male  PRE-OPERATIVE DIAGNOSIS:  CAD  POST-OPERATIVE DIAGNOSIS:  CORONARY ARTERY DISEASE  PROCEDURE:  Procedure(s):  CORONARY ARTERY BYPASS GRAFTING X 4  -LIMA to LAD -SVG to RAMUS -SVG to OM -SVG to PLVB  ENDOSCOPIC HARVEST GREATER SAPHENOUS VEIN -Right Leg  TRANSESOPHAGEAL ECHOCARDIOGRAM (TEE) (N/A)  APPLICATION OF CELL SAVER  SURGEON:  Surgeon(s) and Role:    * Lightfoot, Lucile Crater, MD - Primary  PHYSICIAN ASSISTANT: Ellwood Handler PA-C   ASSISTANTS: Ara Kussmaul RNFA   ANESTHESIA:   general  EBL:  969 mL   BLOOD ADMINISTERED:CELLSAVER  DRAINS:  Left Pleural and Mediastinal Drains     LOCAL MEDICATIONS USED:  NONE  SPECIMEN:  No Specimen  DISPOSITION OF SPECIMEN:  N/A  COUNTS:  YES  TOURNIQUET:  * No tourniquets in log *  DICTATION: .Dragon Dictation  PLAN OF CARE: Admit to inpatient   PATIENT DISPOSITION:  ICU - intubated and hemodynamically stable.   Delay start of Pharmacological VTE agent (>24hrs) due to surgical blood loss or risk of bleeding: yes

## 2020-10-16 NOTE — Anesthesia Procedure Notes (Signed)
Central Venous Catheter Insertion Performed by: Duane Boston, MD, anesthesiologist Start/End7/20/2022 7:48 AM, 10/16/2020 7:58 AM Patient location: Pre-op. Preanesthetic checklist: patient identified, IV checked, site marked, risks and benefits discussed, surgical consent, monitors and equipment checked, pre-op evaluation, timeout performed and anesthesia consent Position: Trendelenburg Lidocaine 1% used for infiltration and patient sedated Hand hygiene performed , maximum sterile barriers used  and Seldinger technique used Catheter size: 8.5 Fr Total catheter length 8. Central line was placed.Sheath introducer Procedure performed using ultrasound guided technique. Ultrasound Notes:anatomy identified, needle tip was noted to be adjacent to the nerve/plexus identified, no ultrasound evidence of intravascular and/or intraneural injection and image(s) printed for medical record Attempts: 1 Following insertion, line sutured, dressing applied and Biopatch. Post procedure assessment: blood return through all ports, free fluid flow and no air  Patient tolerated the procedure well with no immediate complications. Additional procedure comments: Slick triple-lumen inserted.Marland Kitchen

## 2020-10-16 NOTE — Procedures (Signed)
Extubation Procedure Note  Patient Details:   Name: [place] H Sliwinski DOB: 1950-10-17 MRN: [id number]   Airway Documentation:    Vent end date: 10/16/20 Vent end time: 1855   Evaluation  O2 sats: stable throughout Complications: No apparent complications Patient did tolerate procedure well. Bilateral Breath Sounds: Clear, Rhonchi   Yes  Patient extubated per protocol and order to 4L [place] with no apparent complications. Positive cuff leak was noted prior to extubation. Patient achieved NIF of -26 and VC of .65L with good effort. Vitals are stable. RT will continue to monitor.   Amiayah Giebel Clyda Greener 10/16/2020, 6:59 PM

## 2020-10-16 NOTE — Anesthesia Procedure Notes (Signed)
Procedure Name: Intubation Date/Time: 10/16/2020 8:48 AM Performed by: Valda Favia, CRNA Pre-anesthesia Checklist: Patient identified, Emergency Drugs available, Suction available and Patient being monitored Patient Re-evaluated:Patient Re-evaluated prior to induction Oxygen Delivery Method: Circle System Utilized Preoxygenation: Pre-oxygenation with 100% oxygen Induction Type: IV induction Ventilation: Mask ventilation without difficulty Laryngoscope Size: Mac and 4 Grade View: Grade II Tube type: Oral Tube size: 8.0 mm Number of attempts: 1 Airway Equipment and Method: Stylet and Oral airway Placement Confirmation: ETT inserted through vocal cords under direct vision, positive ETCO2 and breath sounds checked- equal and bilateral Secured at: 23 cm Tube secured with: Tape Dental Injury: Teeth and Oropharynx as per pre-operative assessment

## 2020-10-16 NOTE — Anesthesia Postprocedure Evaluation (Signed)
Anesthesia Post Note  Patient: Martin[place] H Bacote  Procedure(s) Performed: CORONARY ARTERY BYPASS GRAFTING (CABG) X  FOUR  ON PUMP USING LEFT INTERNAL MAMMARY ARTERY AND RIGHT ENDOSCOPIC GREATER SAPHENOUS VEIN CONDUITS. (Chest) TRANSESOPHAGEAL ECHOCARDIOGRAM (TEE) APPLICATION OF CELL SAVER ENDOVEIN HARVEST OF GREATER SAPHENOUS VEIN (Right)     Patient location during evaluation: SICU Anesthesia Type: General Level of consciousness: sedated Pain management: pain level controlled Vital Signs Assessment: post-procedure vital signs reviewed and stable Respiratory status: patient remains intubated per anesthesia plan Cardiovascular status: stable Postop Assessment: no apparent nausea or vomiting Anesthetic complications: no   No notable events documented.  Last Vitals:  Vitals:   10/16/20 1530 10/16/20 1600  BP:  129/86  Pulse: 74 73  Resp: 16 16  Temp: 36.8 C 36.9 C  SpO2: 99% 100%    Last Pain:  Vitals:   10/16/20 0720  TempSrc:   PainSc: 5                  Harvis Mabus DANIEL

## 2020-10-16 NOTE — Progress Notes (Signed)
Patient ID: Harry Olson, male   DOB: 1951-01-25, 70 y.o.   MRN: [id number]  TCTS Evening Rounds:   Hemodynamically stable  CI = 2.2  Ready for extubation.  Urine output good  CT output low  CBC    Component Value Date/Time   WBC 8.9 10/16/2020 0424   RBC 4.72 10/16/2020 0424   HGB 10.9 (L) 10/16/2020 1513   HCT 32.0 (L) 10/16/2020 1513   PLT 172 10/16/2020 1239   MCV 88.6 10/16/2020 0424   MCH 29.9 10/16/2020 0424   MCHC 33.7 10/16/2020 0424   RDW 12.4 10/16/2020 0424     BMET    Component Value Date/Time   NA 144 10/16/2020 1513   K 3.5 10/16/2020 1513   CL 108 10/16/2020 1345   CO2 25 10/16/2020 0424   GLUCOSE 190 (H) 10/16/2020 1345   BUN 13 10/16/2020 1345   CREATININE 0.90 10/16/2020 1345   CALCIUM 9.2 10/16/2020 0424   GFRNONAA 55 (L) 10/16/2020 0424   GFRAA >60 01/13/2017 0602     A/P:  Stable postop course. Continue current plans

## 2020-10-16 NOTE — Progress Notes (Signed)
Cardiology Progress Note  Patient ID: Harry Olson MRN: [id number] DOB: 12/02/1950 Date of Encounter: 10/16/2020  Primary Cardiologist: None  Subjective   Chief Complaint: Foot pain  HPI: Reports foot pain and likely gout this morning.  Plan for OR this morning for CABG.  Denies any chest pain.  ROS:  All other ROS reviewed and negative. Pertinent positives noted in the HPI.     Inpatient Medications  Scheduled Meds:  aspirin  81 mg Oral Daily   atorvastatin  80 mg Oral Daily   bisacodyl  5 mg Oral Once   Chlorhexidine Gluconate Cloth  6 each Topical Q0600   epinephrine  0-10 mcg/min Intravenous To OR   heparin-papaverine-plasmalyte irrigation   Irrigation To OR   insulin   Intravenous To OR   [facility] Blood Cardioplegia vial (lidocaine/magnesium/mannitol 0.26g-4g-6.4g)   Intracoronary Once   mupirocin ointment  1 application Nasal BID   phenylephrine  30-200 mcg/min Intravenous To OR   potassium chloride  80 mEq Other To OR   sertraline  100 mg Oral Daily   tranexamic acid  15 mg/kg (Adjusted) Intravenous To OR   tranexamic acid  2 mg/kg Intracatheter To OR   traZODone  100 mg Oral QHS   Continuous Infusions:   ceFAZolin (ANCEF) IV      ceFAZolin (ANCEF) IV     dexmedetomidine     heparin 30,000 units/NS 1000 mL solution for CELLSAVER     heparin 1,550 Units/hr (10/15/20 1712)   milrinone     nitroGLYCERIN     norepinephrine     tranexamic acid (CYKLOKAPRON) infusion ([facility])     vancomycin     PRN Meds: acetaminophen, cyclobenzaprine, dicyclomine, hydrOXYzine, nitroGLYCERIN, ondansetron (ZOFRAN) IV, oxyCODONE, polyethylene glycol   Vital Signs   Vitals:   10/15/20 1113 10/15/20 1957 10/15/20 2337 10/16/20 0436  BP: (!) 177/94 (!) 149/90 136/67 124/75  Pulse: 61 78 79 64  Resp: 20 19 18 17   Temp: 99 F (37.2 C) 99.6 F (37.6 C) 98.9 F (37.2 C) 97.8 F (36.6 C)  TempSrc: Axillary Oral Oral Oral  SpO2: 95% 92% 92% 93%  Weight:    95 kg  Height:         Intake/Output Summary (Last 24 hours) at 10/16/2020 0729 Last data filed at 10/16/2020 0526 Gross per 24 hour  Intake 600 ml  Output 1000 ml  Net -400 ml   Last 3 Weights 10/16/2020 10/15/2020 10/13/2020  Weight (lbs) 209 lb 7 oz 211 lb 3.2 oz 211 lb  Weight (kg) 95 kg 95.8 kg 95.709 kg      Telemetry  Overnight telemetry shows sinus bradycardia heart rate in the 50s, which I personally reviewed.   Physical Exam   Vitals:   10/15/20 1113 10/15/20 1957 10/15/20 2337 10/16/20 0436  BP: (!) 177/94 (!) 149/90 136/67 124/75  Pulse: 61 78 79 64  Resp: 20 19 18 17   Temp: 99 F (37.2 C) 99.6 F (37.6 C) 98.9 F (37.2 C) 97.8 F (36.6 C)  TempSrc: Axillary Oral Oral Oral  SpO2: 95% 92% 92% 93%  Weight:    95 kg  Height:        Intake/Output Summary (Last 24 hours) at 10/16/2020 0729 Last data filed at 10/16/2020 0526 Gross per 24 hour  Intake 600 ml  Output 1000 ml  Net -400 ml    Last 3 Weights 10/16/2020 10/15/2020 10/13/2020  Weight (lbs) 209 lb 7 oz 211 lb 3.2 oz 211 lb  Weight (kg) 95 kg 95.8 kg 95.709 kg    Body mass index is 29.21 kg/m.  General: Well nourished, well developed, in no acute distress Head: Atraumatic, normal size  Eyes: PEERLA, EOMI  Neck: Supple, no JVD Endocrine: No thryomegaly Cardiac: Normal S1, S2; RRR; no murmurs, rubs, or gallops Lungs: Clear to auscultation bilaterally, no wheezing, rhonchi or rales  Abd: Soft, nontender, no hepatomegaly  Ext: No edema, pulses 2+ Musculoskeletal: No deformities, BUE and BLE strength normal and equal Skin: Warm and dry, no rashes   Neuro: Alert and oriented to person, place, time, and situation, CNII-XII grossly intact, no focal deficits  Psych: Normal mood and affect   Labs  High Sensitivity Troponin:   Recent Labs  Lab 10/13/20 1740 10/13/20 1940  TROPONINIHS 23* 25*     Cardiac EnzymesNo results for input(s): TROPONINI in the last 168 hours. No results for input(s): TROPIPOC in the last 168 hours.   Chemistry Recent Labs  Lab 10/15/20 0034 10/15/20 2050 10/16/20 0424  NA 138 138 137  K 3.7 3.5 3.7  CL 105 106 104  CO2 26 24 25   GLUCOSE 118* 167* 155*  BUN 11 12 15   CREATININE 1.21 1.24 1.38*  CALCIUM 9.3 9.3 9.2  PROT  --  6.6  --   ALBUMIN  --  3.5  --   AST  --  21  --   ALT  --  23  --   ALKPHOS  --  60  --   BILITOT  --  0.9  --   GFRNONAA >60 >60 55*  ANIONGAP 7 8 8     Hematology Recent Labs  Lab 10/14/20 0304 10/15/20 0034 10/16/20 0424  WBC 6.5 8.3 8.9  RBC 4.26 4.73 4.72  HGB 12.9* 14.2 14.1  HCT 38.1* 42.0 41.8  MCV 89.4 88.8 88.6  MCH 30.3 30.0 29.9  MCHC 33.9 33.8 33.7  RDW 12.2 12.1 12.4  PLT 172 198 195   BNPNo results for input(s): BNP, PROBNP in the last 168 hours.  DDimer No results for input(s): DDIMER in the last 168 hours.   Radiology  DG Chest 2 View  Result Date: 10/15/2020 CLINICAL DATA:  Preoperative assessment for CABG, coronary artery disease EXAM: CHEST - 2 VIEW COMPARISON:  10/13/2020 FINDINGS: Frontal and lateral views of the chest demonstrate an unremarkable cardiac silhouette. No acute airspace disease, effusion, or pneumothorax. No acute bony abnormalities. IMPRESSION: 1. No acute intrathoracic process. Electronically Signed   By: Randa Ngo M.D.   On: 10/15/2020 20:21   CT CHEST WO CONTRAST  Result Date: 10/14/2020 CLINICAL DATA:  Chest pain.  Question lung nodule on radiograph. EXAM: CT CHEST WITHOUT CONTRAST TECHNIQUE: Multidetector CT imaging of the chest was performed following the standard protocol without IV contrast. COMPARISON:  Radiograph yesterday. FINDINGS: Cardiovascular: Aortic atherosclerosis. No aneurysm. Left vertebral artery arises directly from the thoracic aorta, variant anatomy. Coronary artery calcifications. Heart is normal in size. Trace pericardial fluid/thickening anteriorly. Mediastinum/Nodes: Scattered small mediastinal lymph nodes not enlarged by size criteria. Limited assessment for hilar  adenopathy in this unenhanced exam. There is mild wall thickening of the distal esophagus. Surgical clips in the right axilla. No axillary adenopathy no visualized thyroid nodule. Lungs/Pleura: No pulmonary nodule, particularly no nodule in the left upper lobe corresponding to that on prior radiograph. There is mild biapical pleuroparenchymal scarring. Minimal subsegmental and hypoventilatory atelectasis in the lung bases. No consolidation or pleural effusion. No pulmonary edema. Trachea and central bronchi are  patent. Upper Abdomen: 5 mm calcification in the upper left kidney is adjacent 2 cortical cysts, may be a wall calcification or nonobstructing stone. Cysts in the mid right kidney are partially included. No acute upper abdominal findings. Musculoskeletal: Thoracic spondylosis with endplate spurring. There are no acute or suspicious osseous abnormalities. Mild hypertrophy of the left first rib costochondral junction accounting for radiographic appearance of nodule. IMPRESSION: 1. No pulmonary nodule, particularly no nodule in the left upper lobe corresponding to that on prior radiograph. Radiographic findings were related to the costochondral cartilage of the first rib. 2. Mild wall thickening of the distal esophagus, can be seen with reflux or esophagitis. 3. Aortic atherosclerosis.  Coronary artery calcifications. Aortic Atherosclerosis (ICD10-I70.0). Electronically Signed   By: Keith Rake M.D.   On: 10/14/2020 17:43   ECHOCARDIOGRAM COMPLETE  Result Date: 10/14/2020    ECHOCARDIOGRAM REPORT   Patient Name:   Harry Olson Date of Exam: 10/14/2020 Medical Rec #:  [id number]        Height:       71.0 in Accession #:    [id number]       Weight:       211.0 lb Date of Birth:  Jun 27, 1950        BSA:          2.157 m Patient Age:    70 years         BP:           100/58 mmHg Patient Gender: M                HR:           56 bpm. Exam Location:  Inpatient Procedure: 2D Echo, Color Doppler and Cardiac  Doppler Indications:    Acute myocardial infarction, unspecified I21.9  History:        Patient has no prior history of Echocardiogram examinations.                 Stroke; Risk Factors:Dyslipidemia and Hypertension.  Sonographer:    Bernadene Person RDCS Referring Phys: [id number] Harry TANNENBAUM IMPRESSIONS  1. Left ventricular ejection fraction, by estimation, is 60 to 65%. The left ventricle has normal function. The left ventricle has no regional wall motion abnormalities. Left ventricular diastolic parameters are consistent with Grade II diastolic dysfunction (pseudonormalization).  2. Right ventricular systolic function is normal. The right ventricular size is normal.  3. The mitral valve is normal in structure. No evidence of mitral valve regurgitation. No evidence of mitral stenosis.  4. The aortic valve is normal in structure. Aortic valve regurgitation is not visualized. No aortic stenosis is present.  5. The inferior vena cava is normal in size with greater than 50% respiratory variability, suggesting right atrial pressure of 3 mmHg. FINDINGS  Left Ventricle: Left ventricular ejection fraction, by estimation, is 60 to 65%. The left ventricle has normal function. The left ventricle has no regional wall motion abnormalities. The left ventricular internal cavity size was normal in size. There is  no left ventricular hypertrophy. Left ventricular diastolic parameters are consistent with Grade II diastolic dysfunction (pseudonormalization). Right Ventricle: The right ventricular size is normal. No increase in right ventricular wall thickness. Right ventricular systolic function is normal. Left Atrium: Left atrial size was normal in size. Right Atrium: Right atrial size was normal in size. Pericardium: There is no evidence of pericardial effusion. Mitral Valve: The mitral valve is normal in structure. No evidence of mitral valve regurgitation. No evidence  of mitral valve stenosis. Tricuspid Valve: The tricuspid  valve is normal in structure. Tricuspid valve regurgitation is not demonstrated. No evidence of tricuspid stenosis. Aortic Valve: The aortic valve is normal in structure. Aortic valve regurgitation is not visualized. No aortic stenosis is present. Pulmonic Valve: The pulmonic valve was normal in structure. Pulmonic valve regurgitation is not visualized. No evidence of pulmonic stenosis. Aorta: The aortic root is normal in size and structure. Venous: The inferior vena cava is normal in size with greater than 50% respiratory variability, suggesting right atrial pressure of 3 mmHg. IAS/Shunts: No atrial level shunt detected by color flow Doppler.  LEFT VENTRICLE PLAX 2D LVIDd:         5.40 cm  Diastology LVIDs:         3.50 cm  LV e' medial:    6.56 cm/s LV PW:         1.00 cm  LV E/e' medial:  15.5 LV IVS:        0.90 cm  LV e' lateral:   8.00 cm/s LVOT diam:     2.10 cm  LV E/e' lateral: 12.8 LV SV:         73 LV SV Index:   34 LVOT Area:     3.46 cm  RIGHT VENTRICLE RV S prime:     18.80 cm/s TAPSE (M-mode): 2.3 cm LEFT ATRIUM             Index       RIGHT ATRIUM           Index LA diam:        2.60 cm 1.21 cm/m  RA Area:     13.50 cm LA Vol (A2C):   42.2 ml 19.56 ml/m RA Volume:   26.00 ml  12.05 ml/m LA Vol (A4C):   47.5 ml 22.02 ml/m LA Biplane Vol: 47.6 ml 22.07 ml/m  AORTIC VALVE LVOT Vmax:   104.00 cm/s LVOT Vmean:  64.500 cm/s LVOT VTI:    0.210 m  AORTA Ao Root diam: 3.30 cm Ao Asc diam:  3.20 cm MITRAL VALVE MV Area (PHT): 4.68 cm     SHUNTS MV Decel Time: 162 msec     Systemic VTI:  0.21 m MV E velocity: 102.00 cm/s  Systemic Diam: 2.10 cm MV A velocity: 77.10 cm/s MV E/A ratio:  1.32 Candee Furbish MD Electronically signed by Candee Furbish MD Signature Date/Time: 10/14/2020/3:37:43 PM    Final    VAS US DOPPLER PRE CABG  Result Date: 10/14/2020 PREOPERATIVE VASCULAR EVALUATION Patient Name:  Harry Olson  Date of Exam:   10/14/2020 Medical Rec #: [id number]         Accession #:    [id number] Date  of Birth: 09-27-1950         Patient Gender: M Patient Age:   69Y Exam Location:  [facility] Procedure:      VAS US DOPPLER PRE CABG Referring Phys: [id number] HARRELL O LIGHTFOOT --------------------------------------------------------------------------------  Indications:      Pre-CABG. Risk Factors:     Hypertension, hyperlipidemia. Comparison Study: No prior study Performing Technologist: Maudry Mayhew MHA, RDMS, RVT, RDCS  Examination Guidelines: A complete evaluation includes B-mode imaging, spectral Doppler, color Doppler, and power Doppler as needed of all accessible portions of each vessel. Bilateral testing is considered an integral part of a complete examination. Limited examinations for reoccurring indications may be performed as noted.  Right Carotid Findings: +----------+--------+--------+--------+-------------------------+--------+  PSV cm/sEDV cm/sStenosisDescribe                 Comments +----------+--------+--------+--------+-------------------------+--------+ CCA Prox  60      12                                                +----------+--------+--------+--------+-------------------------+--------+ CCA Distal43      7               calcific                          +----------+--------+--------+--------+-------------------------+--------+ ICA Prox  50      8               heterogenous and calcific         +----------+--------+--------+--------+-------------------------+--------+ ICA Distal69      21                                                +----------+--------+--------+--------+-------------------------+--------+ ECA       63      5               heterogenous and calcific         +----------+--------+--------+--------+-------------------------+--------+ Portions of this table do not appear on this page. +----------+--------+-------+----------------+------------+           PSV cm/sEDV cmsDescribe        Arm Pressure  +----------+--------+-------+----------------+------------+ Subclavian66             Multiphasic, WNL             +----------+--------+-------+----------------+------------+ +---------+--------+--+--------+--+---------+ VertebralPSV cm/s32EDV cm/s10Antegrade +---------+--------+--+--------+--+---------+ Left Carotid Findings: +----------+--------+--------+--------+--------------------------+--------+           PSV cm/sEDV cm/sStenosisDescribe                  Comments +----------+--------+--------+--------+--------------------------+--------+ CCA Prox  53      12                                                 +----------+--------+--------+--------+--------------------------+--------+ CCA Distal49      12              heterogenous and irregular         +----------+--------+--------+--------+--------------------------+--------+ ICA Prox  33      11                                                 +----------+--------+--------+--------+--------------------------+--------+ ICA Distal94      27                                                 +----------+--------+--------+--------+--------------------------+--------+ ECA       55      7                                                  +----------+--------+--------+--------+--------------------------+--------+ +----------+--------+--------+----------------+------------+  SubclavianPSV cm/sEDV cm/sDescribe        Arm Pressure +----------+--------+--------+----------------+------------+           139             Multiphasic, WNL             +----------+--------+--------+----------------+------------+ +---------+--------+--+--------+--+---------+ VertebralPSV cm/s49EDV cm/s14Antegrade +---------+--------+--+--------+--+---------+  ABI Findings: +--------+------------------+-----+---------+--------+ Right   Rt Pressure (mmHg)IndexWaveform Comment   +--------+------------------+-----+---------+--------+ [id number]                    triphasic         +--------+------------------+-----+---------+--------+ PTA     206               1.14 triphasic         +--------+------------------+-----+---------+--------+ DP      193               1.07 triphasic         +--------+------------------+-----+---------+--------+ +--------+------------------+-----+---------+-------+ Left    Lt Pressure (mmHg)IndexWaveform Comment +--------+------------------+-----+---------+-------+ Brachial180                    triphasic        +--------+------------------+-----+---------+-------+ PTA     214               1.19 triphasic        +--------+------------------+-----+---------+-------+ DP      212               1.18 triphasic        +--------+------------------+-----+---------+-------+ +-------+---------------+----------------+ ABI/TBIToday's ABI/TBIPrevious ABI/TBI +-------+---------------+----------------+ Right  1.14                            +-------+---------------+----------------+ Left   1.19                            +-------+---------------+----------------+  Right Doppler Findings: +--------+--------+-----+---------+--------+ Site    PressureIndexDoppler  Comments +--------+--------+-----+---------+--------+ [id number]          triphasic         +--------+--------+-----+---------+--------+ Radial               triphasic         +--------+--------+-----+---------+--------+ Ulnar                biphasic          +--------+--------+-----+---------+--------+  Left Doppler Findings: +--------+--------+-----+---------+--------+ Site    PressureIndexDoppler  Comments +--------+--------+-----+---------+--------+ Brachial180          triphasic         +--------+--------+-----+---------+--------+ Radial               triphasic         +--------+--------+-----+---------+--------+ Ulnar                 biphasic          +--------+--------+-----+---------+--------+  Summary: Right Carotid: Velocities in the right ICA are consistent with a 1-39% stenosis. Left Carotid: Velocities in the left ICA are consistent with a 1-39% stenosis. Vertebrals:  Bilateral vertebral arteries demonstrate antegrade flow. Subclavians: Normal flow hemodynamics were seen in bilateral subclavian              arteries. Right ABI: Resting right ankle-brachial index is within normal range. No evidence of significant right lower extremity arterial disease. Left ABI: Resting left ankle-brachial index is within normal range. No evidence of significant left lower extremity arterial disease. Right  Upper Extremity: Doppler waveform obliterate with right radial compression. Doppler waveforms remain within normal limits with right ulnar compression. Left Upper Extremity: Doppler waveform obliterate with left radial compression. Doppler waveforms remain within normal limits with left ulnar compression.  Electronically signed by Ruta Hinds MD on 10/14/2020 at 7:49:39 PM.    Final     Cardiac Studies  TTE 10/14/2020  1. Left ventricular ejection fraction, by estimation, is 60 to 65%. The  left ventricle has normal function. The left ventricle has no regional  wall motion abnormalities. Left ventricular diastolic parameters are  consistent with Grade II diastolic  dysfunction (pseudonormalization).   2. Right ventricular systolic function is normal. The right ventricular  size is normal.   3. The mitral valve is normal in structure. No evidence of mitral valve  regurgitation. No evidence of mitral stenosis.   4. The aortic valve is normal in structure. Aortic valve regurgitation is  not visualized. No aortic stenosis is present.   5. The inferior vena cava is normal in size with greater than 50%  respiratory variability, suggesting right atrial pressure of 3 mmHg.   Patient Profile  Harry Olson is a 70 y.o.  male with 3v CAD, HTN, HLD who was admitted 10/13/2020 with progressive angina/unstable angina.   Assessment & Plan   Unstable angina -Admitted with unstable angina.  Three-vessel CAD on cath from [facility]. -EKG stable. -No further chest pain. -On aspirin heparin drip.  Beta-blocker being held.  On high intensity statin. -Plan for CABG today with Dr. Kipp Brood.  2.  History of stroke -No longer on Xarelto.  Was on this in the past.  3.  Hyperlipidemia -High intensity statin.  4.  Abnormal chest x-ray -Concerns for left upper lobe nodule on chest x-ray.  CT scan shows this was artifact.  5.  Depression/insomnia -Home medications  6. Gout -Reports right great toe pain.  Plan for OR today.  Likely can start colchicine after surgery.  FEN -No intravenous fluids -Diet: N.p.o. -DVT Ppx: Heparin drip -Code: Full  For questions or updates, please contact [facility] Please consult [email or web address] for contact info under   Time Spent with Patient: I have spent a total of 35 minutes with patient reviewing hospital notes, telemetry, EKGs, labs and examining the patient as well as establishing an assessment and plan that was discussed with the patient.  > 50% of time was spent in direct patient care.    Signed, Addison Naegeli. Audie Box, MD, [place]  10/16/2020 7:29 AM

## 2020-10-16 NOTE — Progress Notes (Signed)
  Echocardiogram Echocardiogram Transesophageal has been performed.  Harry Olson 10/16/2020, 9:04 AM

## 2020-10-16 NOTE — Hospital Course (Addendum)
History of Present Illness:  Mr. Legault is a 70 year old male with a past medical history significant for  hyperlipidemia, hypertension, BPH, depression, anxiety, and insomnia who presents with chest pain at rest and a negative troponin. Two days prior to presentation he had chest pain that was heavy, left-sided, and worse with exertion. When his chest pain made its way to his left neck and around to his back he decided to go to the ED. He was also having shortness of breath at the time.  He underwent a cardiac catheterization at [facility] which showed 3 vessel CAD and was waiting for a referral before he decided to just come to the ED. He did take a few nitro tabs before without relief. An EKG was performed without ischemic changes. No echocardiogram or carotid US available for review at this time. He is not having any chest pain at the moment and he is on nitro gtt and heparin gtt. He was evaluated by Dr. Kipp Brood who was in agreement the patient would benefit from coronary bypass grafting procedure.  The risks and benefits of the procedure were explained to the patient and he was agreeable to proceed.  Hospital Course:  The patient remained chest pain free prior to his surgery.  He was taken to the operating room and underwent CABG x 4 utilizing LIMA to LAD, SVG to OM, SVG to Ramus Intermediate, and SVG to PLVB.  He also underwent endoscopic harvest of greater saphenous vein from his right leg.  He tolerated the procedure without difficulty and was taken to the SICU in stable condition. The patient was extubated the evening of surgery.  The patient was weaned off Neo-synephrine as hemodynamics allowed.  He was treated with Levemir and his insulin drip was discontinued.  The patient remains in NSR.  His blood pressure was labile and Lopressor was held.  His chest tubes and arterial lines were removed on POD #2.  His blood sugars improved after dose of Levemir and he was transitioned to SSIP.  The patient was  transferred to the progressive care unit on 10/19/2020. He continued to struggle with shortness of breath due to anxiety at night and was requiring morphine to get a few hours of sleep. We continued a diuretic regimen for fluid overload. We discontinued his glucose checks since he was not a diabetic.

## 2020-10-17 ENCOUNTER — Inpatient Hospital Stay (HOSPITAL_COMMUNITY): Payer: Medicare Other

## 2020-10-17 ENCOUNTER — Encounter (HOSPITAL_COMMUNITY): Payer: Self-pay | Admitting: Thoracic Surgery (Cardiothoracic Vascular Surgery)

## 2020-10-17 LAB — BASIC METABOLIC PANEL
Anion gap: 6 (ref 5–15)
Anion gap: 5 (ref 5–15)
BUN: 12 mg/dL (ref 8–23)
BUN: 14 mg/dL (ref 8–23)
CO2: 24 mmol/L (ref 22–32)
CO2: 28 mmol/L (ref 22–32)
Calcium: 7.5 mg/dL — ABNORMAL LOW (ref 8.9–10.3)
Calcium: 8 mg/dL — ABNORMAL LOW (ref 8.9–10.3)
Chloride: 109 mmol/L (ref 98–111)
Chloride: 105 mmol/L (ref 98–111)
Creatinine, Ser: 1.16 mg/dL (ref 0.61–1.24)
Creatinine, Ser: 1.19 mg/dL (ref 0.61–1.24)
GFR, Estimated: >60 mL/min (ref >60)
GFR, Estimated: >60 mL/min (ref >60)
Glucose, Bld: 129 mg/dL — ABNORMAL HIGH (ref 70–99)
Glucose, Bld: 124 mg/dL — ABNORMAL HIGH (ref 70–99)
Potassium: 3.9 mmol/L (ref 3.5–5.1)
Potassium: 3.9 mmol/L (ref 3.5–5.1)
Sodium: 139 mmol/L (ref 135–145)
Sodium: 138 mmol/L (ref 135–145)

## 2020-10-17 LAB — GLUCOSE, CAPILLARY
Glucose-Capillary: 112 mg/dL — ABNORMAL HIGH (ref 70–99)
Glucose-Capillary: 112 mg/dL — ABNORMAL HIGH (ref 70–99)
Glucose-Capillary: 126 mg/dL — ABNORMAL HIGH (ref 70–99)
Glucose-Capillary: 129 mg/dL — ABNORMAL HIGH (ref 70–99)
Glucose-Capillary: 136 mg/dL — ABNORMAL HIGH (ref 70–99)
Glucose-Capillary: 136 mg/dL — ABNORMAL HIGH (ref 70–99)
Glucose-Capillary: 136 mg/dL — ABNORMAL HIGH (ref 70–99)
Glucose-Capillary: 145 mg/dL — ABNORMAL HIGH (ref 70–99)
Glucose-Capillary: 154 mg/dL — ABNORMAL HIGH (ref 70–99)
Glucose-Capillary: 159 mg/dL — ABNORMAL HIGH (ref 70–99)
Glucose-Capillary: 172 mg/dL — ABNORMAL HIGH (ref 70–99)
Glucose-Capillary: 173 mg/dL — ABNORMAL HIGH (ref 70–99)
Glucose-Capillary: 95 mg/dL (ref 70–99)

## 2020-10-17 LAB — CBC
HCT: 30.9 % — ABNORMAL LOW (ref 39.0–52.0)
HCT: 28.1 % — ABNORMAL LOW (ref 39.0–52.0)
Hemoglobin: 10.5 g/dL — ABNORMAL LOW (ref 13.0–17.0)
Hemoglobin: 9.5 g/dL — ABNORMAL LOW (ref 13.0–17.0)
MCH: 30.8 pg (ref 26.0–34.0)
MCH: 31 pg (ref 26.0–34.0)
MCHC: 34 g/dL (ref 30.0–36.0)
MCHC: 33.8 g/dL (ref 30.0–36.0)
MCV: 90.6 fL (ref 80.0–100.0)
MCV: 91.8 fL (ref 80.0–100.0)
Platelets: 134 10*3/uL — ABNORMAL LOW (ref 150–400)
Platelets: 111 10*3/uL — ABNORMAL LOW (ref 150–400)
RBC: 3.41 MIL/uL — ABNORMAL LOW (ref 4.22–5.81)
RBC: 3.06 MIL/uL — ABNORMAL LOW (ref 4.22–5.81)
RDW: 12.6 % (ref 11.5–15.5)
RDW: 12.8 % (ref 11.5–15.5)
WBC: 10.7 10*3/uL — ABNORMAL HIGH (ref 4.0–10.5)
WBC: 8.1 10*3/uL (ref 4.0–10.5)
nRBC: 0 % (ref 0.0–0.2)
nRBC: 0 % (ref 0.0–0.2)

## 2020-10-17 LAB — MAGNESIUM
Magnesium: 2 mg/dL (ref 1.7–2.4)
Magnesium: 2.1 mg/dL (ref 1.7–2.4)

## 2020-10-17 MED ORDER — INSULIN DETEMIR 100 UNIT/ML ~~LOC~~ SOLN
20.0000 [IU] | Freq: Once | SUBCUTANEOUS | Status: AC
  Administered 2020-10-17: 20 [IU] via SUBCUTANEOUS
  Filled 2020-10-17: qty 0.2

## 2020-10-17 MED ORDER — ENOXAPARIN SODIUM 40 MG/0.4ML IJ SOSY
40.0000 mg | PREFILLED_SYRINGE | Freq: Every day | INTRAMUSCULAR | Status: DC
  Administered 2020-10-17: 40 mg via SUBCUTANEOUS
  Filled 2020-10-17: qty 0.4

## 2020-10-17 MED ORDER — INSULIN ASPART 100 UNIT/ML IJ SOLN
0.0000 [IU] | INTRAMUSCULAR | Status: DC
  Administered 2020-10-17: 2 [IU] via SUBCUTANEOUS
  Administered 2020-10-17: 4 [IU] via SUBCUTANEOUS
  Administered 2020-10-18 (×2): 2 [IU] via SUBCUTANEOUS

## 2020-10-17 MED FILL — Potassium Chloride Inj 2 mEq/ML: INTRAVENOUS | Qty: 40 | Status: AC

## 2020-10-17 MED FILL — Heparin Sodium (Porcine) Inj 1000 Unit/ML: INTRAMUSCULAR | Qty: 30 | Status: AC

## 2020-10-17 MED FILL — Lidocaine HCl Local Preservative Free (PF) Inj 2%: INTRAMUSCULAR | Qty: 15 | Status: AC

## 2020-10-17 NOTE — Progress Notes (Signed)
EKG CRITICAL VALUE     12 lead EKG performed.  Critical value noted.  Briscoe Deutscher , RN notified.   Warren Lacy, CCT 10/17/2020 7:46 AM

## 2020-10-17 NOTE — Progress Notes (Signed)
1 Day Post-Op Procedure(s) (LRB): CORONARY ARTERY BYPASS GRAFTING (CABG) X  FOUR  ON PUMP USING LEFT INTERNAL MAMMARY ARTERY AND RIGHT ENDOSCOPIC GREATER SAPHENOUS VEIN CONDUITS. (N/A) TRANSESOPHAGEAL ECHOCARDIOGRAM (TEE) (N/A) APPLICATION OF CELL SAVER ENDOVEIN HARVEST OF GREATER SAPHENOUS VEIN (Right) Subjective: Feels well, minimal pain  Objective: Vital signs in last 24 hours: Temp:  [98.24 F (36.8 C)-101.3 F (38.5 C)] 98.96 F (37.2 C) (07/21 0700) Pulse Rate:  [64-93] 76 (07/21 0700) Cardiac Rhythm: Normal sinus rhythm (07/20 2000) Resp:  [10-37] 17 (07/21 0700) BP: (94-145)/(52-97) 98/53 (07/21 0700) SpO2:  [92 %-100 %] 93 % (07/21 0700) Arterial Line BP: (91-138)/(43-93) 106/52 (07/21 0700) FiO2 (%):  [40 %-50 %] 40 % (07/20 1819)  Hemodynamic parameters for last 24 hours:    Intake/Output from previous day: 07/20 0701 - 07/21 0700 In: 6503.5 [P.O.:690; I.V.:3912.2; Blood:830; IV Piggyback:1234.1] Out: 4656 [Urine:2075; Blood:969; Chest Tube:810] Intake/Output this shift: No intake/output data recorded.  General appearance: alert and cooperative Neurologic: intact Heart: regular rate and rhythm, S1, S2 normal, no murmur Lungs: clear to auscultation bilaterally Extremities: edema mild Wound: dressings dry  Lab Results: Recent Labs    10/16/20 2010 10/17/20 0454  WBC 10.9* 10.7*  HGB 12.1* 10.5*  HCT 36.4* 30.9*  PLT 141* 134*   BMET:  Recent Labs    10/16/20 1817 10/16/20 1845 10/16/20 2002 10/17/20 0454  NA 137   < > 143 139  K 4.0   < > 4.1 3.9  CL 106  --   --  109  CO2 22  --   --  24  GLUCOSE 189*  --   --  129*  BUN 12  --   --  12  CREATININE 1.15  --   --  1.16  CALCIUM 7.6*  --   --  7.5*   < > = values in this interval not displayed.    PT/INR:  Recent Labs    10/16/20 1507  LABPROT 16.0*  INR 1.3*   ABG    Component Value Date/Time   PHART 7.326 (L) 10/16/2020 2002   HCO3 22.2 10/16/2020 2002   TCO2 23 10/16/2020 2002    ACIDBASEDEF 3.0 (H) 10/16/2020 2002   O2SAT 93.0 10/16/2020 2002   CBG (last 3)  Recent Labs    10/17/20 0311 10/17/20 0415 10/17/20 0551  GLUCAP 145* 136* 136*   CXR: mild bibasilar atelectasis  ECG: sinus, mild inferolateral ST changes.  Assessment/Plan: S/P Procedure(s) (LRB): CORONARY ARTERY BYPASS GRAFTING (CABG) X  FOUR  ON PUMP USING LEFT INTERNAL MAMMARY ARTERY AND RIGHT ENDOSCOPIC GREATER SAPHENOUS VEIN CONDUITS. (N/A) TRANSESOPHAGEAL ECHOCARDIOGRAM (TEE) (N/A) APPLICATION OF CELL SAVER ENDOVEIN HARVEST OF GREATER SAPHENOUS VEIN (Right)  POD 1 Hemodynamically stable on neo 20 mcg this am. Wean as tolerated. Hold off on beta blocker until BP stable off neo.  Still on insulin drip. Will give a dose of Levemir this am and start SSI. Preop Hgb A1c was 5.4 on no meds.  Volume status ok. Wt at preop. May need some volume to get off neo.  Chest tube output low. Will keep in this am and may remove later after mobilization.  IS, OOB.   LOS: 4 days    Harry Olson 10/17/2020

## 2020-10-17 NOTE — Progress Notes (Signed)
      [address]       [place] [address]             [phone]      POD # 1 CABG x 4  No complaints, ambulated earlier  BP (!) 118/54   Pulse 80   Temp 98.5 F (36.9 C) (Oral)   Resp (!) 25   Ht 5\' 11"  (1.803 m)   Wt 100 kg   SpO2 93%   BMI 30.75 kg/m    Intake/Output Summary (Last 24 hours) at 10/17/2020 1834 Last data filed at 10/17/2020 1800 Gross per 24 hour  Intake 2159.58 ml  Output 1750 ml  Net 409.58 ml   K= 3.9, creatinine 1.19 Hct 28  Doing well POD # 1  Veryl Winemiller C. Roxan Hockey, MD [facility] [phone]

## 2020-10-18 ENCOUNTER — Inpatient Hospital Stay (HOSPITAL_COMMUNITY): Payer: Medicare Other

## 2020-10-18 DIAGNOSIS — Z951 Presence of aortocoronary bypass graft: Secondary | ICD-10-CM

## 2020-10-18 LAB — BASIC METABOLIC PANEL
Anion gap: 4 — ABNORMAL LOW (ref 5–15)
BUN: 14 mg/dL (ref 8–23)
CO2: 27 mmol/L (ref 22–32)
Calcium: 8.1 mg/dL — ABNORMAL LOW (ref 8.9–10.3)
Chloride: 105 mmol/L (ref 98–111)
Creatinine, Ser: 1.2 mg/dL (ref 0.61–1.24)
GFR, Estimated: >60 mL/min (ref >60)
Glucose, Bld: 137 mg/dL — ABNORMAL HIGH (ref 70–99)
Potassium: 3.9 mmol/L (ref 3.5–5.1)
Sodium: 136 mmol/L (ref 135–145)

## 2020-10-18 LAB — GLUCOSE, CAPILLARY
Glucose-Capillary: 142 mg/dL — ABNORMAL HIGH (ref 70–99)
Glucose-Capillary: 130 mg/dL — ABNORMAL HIGH (ref 70–99)
Glucose-Capillary: 138 mg/dL — ABNORMAL HIGH (ref 70–99)
Glucose-Capillary: 127 mg/dL — ABNORMAL HIGH (ref 70–99)

## 2020-10-18 LAB — CBC
HCT: 27.7 % — ABNORMAL LOW (ref 39.0–52.0)
Hemoglobin: 9.3 g/dL — ABNORMAL LOW (ref 13.0–17.0)
MCH: 30.9 pg (ref 26.0–34.0)
MCHC: 33.6 g/dL (ref 30.0–36.0)
MCV: 92 fL (ref 80.0–100.0)
Platelets: 113 10*3/uL — ABNORMAL LOW (ref 150–400)
RBC: 3.01 MIL/uL — ABNORMAL LOW (ref 4.22–5.81)
RDW: 12.9 % (ref 11.5–15.5)
WBC: 8.4 10*3/uL (ref 4.0–10.5)
nRBC: 0 % (ref 0.0–0.2)

## 2020-10-18 MED ORDER — SODIUM CHLORIDE 0.9% FLUSH
3.0000 mL | Freq: Two times a day (BID) | INTRAVENOUS | Status: DC
  Administered 2020-10-18 – 2020-10-21 (×5): 3 mL via INTRAVENOUS

## 2020-10-18 MED ORDER — FUROSEMIDE 40 MG PO TABS
40.0000 mg | ORAL_TABLET | Freq: Every day | ORAL | Status: AC
Start: 2020-10-18 — End: 2020-10-20
  Administered 2020-10-18 – 2020-10-20 (×3): 40 mg via ORAL
  Filled 2020-10-18 (×3): qty 1

## 2020-10-18 MED ORDER — INSULIN ASPART 100 UNIT/ML IJ SOLN: 0.0000 [IU] | Freq: Three times a day (TID) | INTRAMUSCULAR | Status: DC

## 2020-10-18 MED ORDER — POTASSIUM CHLORIDE CRYS ER 20 MEQ PO TBCR
20.0000 meq | EXTENDED_RELEASE_TABLET | Freq: Two times a day (BID) | ORAL | Status: AC
  Administered 2020-10-18 – 2020-10-19 (×4): 20 meq via ORAL
  Filled 2020-10-18 (×4): qty 1

## 2020-10-18 MED ORDER — SODIUM CHLORIDE 0.9% FLUSH: 3.0000 mL | INTRAVENOUS | Status: DC | PRN

## 2020-10-18 MED ORDER — ~~LOC~~ CARDIAC SURGERY, PATIENT & FAMILY EDUCATION: Freq: Once | Status: AC

## 2020-10-18 MED ORDER — SODIUM CHLORIDE 0.9 % IV SOLN: 250.0000 mL | INTRAVENOUS | Status: DC | PRN

## 2020-10-18 NOTE — Progress Notes (Addendum)
Pt just transferred up and would like to walk later. Has ambulated x2 today. Encouraged to walk with staff later today/tonight. Pt practiced IS, 1000 ml. Pt SaO2 99 1/2L O2. D/c'd O2 and maintained 98-99 RA. Set up pts recliner for later. Discussed activity for the weekend. Pt receptive. [place], ACSM 2:31 PM 10/18/2020

## 2020-10-18 NOTE — Discharge Instructions (Signed)

## 2020-10-18 NOTE — Progress Notes (Signed)
Pt transferred to 4east, orders released, VSS, tele inititated.  CHG completed, no complaints of pain at this time. Oriented patient to room and unit.

## 2020-10-18 NOTE — Progress Notes (Addendum)
      [address]       ,[place] [address]             [phone]      2 Days Post-Op Procedure(s) (LRB): CORONARY ARTERY BYPASS GRAFTING (CABG) X  FOUR  ON PUMP USING LEFT INTERNAL MAMMARY ARTERY AND RIGHT ENDOSCOPIC GREATER SAPHENOUS VEIN CONDUITS. (N/A) TRANSESOPHAGEAL ECHOCARDIOGRAM (TEE) (N/A) APPLICATION OF CELL SAVER ENDOVEIN HARVEST OF GREATER SAPHENOUS VEIN (Right)  Subjective:  Patient up in chair.  State he had a rough night, but It was better than the day before.  He has ambulated this morning.  Denies pain, N/V.   He has moved his bowels.  Objective: Vital signs in last 24 hours: Temp:  [97.9 F (36.6 C)-98.8 F (37.1 C)] 97.9 F (36.6 C) (07/21 2346) Pulse Rate:  [66-100] 73 (07/22 0700) Cardiac Rhythm: Normal sinus rhythm (07/22 0400) Resp:  [12-38] 19 (07/22 0700) BP: (94-148)/(52-92) 100/54 (07/22 0700) SpO2:  [90 %-100 %] 100 % (07/22 0700) Arterial Line BP: (103-133)/(41-109) 117/109 (07/21 1200) Weight:  [100.3 kg] 100.3 kg (07/22 0500)  Intake/Output from previous day: 07/21 0701 - 07/22 0700 In: 443 [I.V.:343; IV Piggyback:100] Out: 975 [Urine:825; Chest Tube:150]  General appearance: alert, cooperative, and no distress Heart: regular rate and rhythm Lungs: clear to auscultation bilaterally Abdomen: soft, non-tender; bowel sounds normal; no masses,  no organomegaly Extremities: edema trace Wound: clean and dry  Lab Results: Recent Labs    10/17/20 1613 10/18/20 0418  WBC 8.1 8.4  HGB 9.5* 9.3*  HCT 28.1* 27.7*  PLT 111* 113*   BMET:  Recent Labs    10/17/20 1613 10/18/20 0418  NA 138 136  K 3.9 3.9  CL 105 105  CO2 28 27  GLUCOSE 124* 137*  BUN 14 14  CREATININE 1.19 1.20  CALCIUM 8.0* 8.1*    PT/INR:  Recent Labs    10/16/20 1507  LABPROT 16.0*  INR 1.3*   ABG    Component Value Date/Time   PHART 7.326 (L) 10/16/2020 2002   HCO3 22.2 10/16/2020 2002   TCO2 23 10/16/2020 2002   ACIDBASEDEF 3.0  (H) 10/16/2020 2002   O2SAT 93.0 10/16/2020 2002   CBG (last 3)  Recent Labs    10/18/20 0420 10/18/20 0439 10/18/20 0647  GLUCAP 142* 130* 138*    Assessment/Plan: S/P Procedure(s) (LRB): CORONARY ARTERY BYPASS GRAFTING (CABG) X  FOUR  ON PUMP USING LEFT INTERNAL MAMMARY ARTERY AND RIGHT ENDOSCOPIC GREATER SAPHENOUS VEIN CONDUITS. (N/A) TRANSESOPHAGEAL ECHOCARDIOGRAM (TEE) (N/A) APPLICATION OF CELL SAVER ENDOVEIN HARVEST OF GREATER SAPHENOUS VEIN (Right)  NSR, BP runnings 90s-140s- will continue to hold Lopressor until BP improves Pulm- CXR with atelectasis, small left pleural effusion, CT output at 150 cc output yesterday... will d/c chest tube today, continue IS Renal- creatinine stable at 1.20, weight is up about 10 lbs- will need low dose Lasix once BP improves Expected post operative blood loss anemia- Hgb at 9.3 CBGs controlled- patient is not a diabetic, will continue SSIP for now, if sugars remain controlled can d/c in AM Dispo- patient stable, will transfer to 4E today, proceed with debridement of lines, chest tubes, and foley, doing well POD #2   LOS: 5 days    Ellwood Handler, PA-C  10/18/2020   Chart reviewed, patient examined, agree with above. He looks good overall. Will start oral lasix today since BP trend is upward.  Transfer to 4E.

## 2020-10-18 NOTE — Discharge Summary (Signed)
[address]       [place] [address]             [phone]    Physician Discharge Summary  Patient ID: Harry Olson MRN: [id number] DOB/AGE: 12/01/1950 70 y.o.  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 Two days prior to presentation he had chest pain that was heavy, left-sided, and worse with exertion. When his chest pain made its way to his left neck and around to his back he decided to go to the ED. He was also having shortness of breath at the time.  He underwent a cardiac catheterization at [facility] which showed 3 vessel CAD and was waiting for a referral before he decided to just come to the ED. He did take a few nitro tabs before without relief. An EKG was performed without ischemic changes. No echocardiogram or carotid US available for review at this time. He is not having any chest pain at the moment and he is on nitro gtt and heparin gtt. He was evaluated by Dr. Kipp Brood who was in agreement the patient would benefit from coronary bypass grafting procedure.  The risks and benefits of the procedure were explained to the patient and he was agreeable to proceed.  Hospital Course:  The patient  remained chest pain free prior to his surgery.  He was taken to the operating room and underwent CABG x 4 utilizing LIMA to LAD, SVG to OM, SVG to Ramus Intermediate, and SVG to PLVB.  He also underwent endoscopic harvest of greater saphenous vein from his right leg.  He tolerated the procedure without difficulty and was taken to the SICU in stable condition. The patient was extubated the evening of surgery.  The patient was weaned off Neo-synephrine as hemodynamics allowed.  He was treated with Levemir and his insulin drip was discontinued.  The patient remains in NSR.  His blood pressure was labile and Lopressor was held.  His chest tubes and arterial lines were removed on POD #2.  His blood sugars improved after dose of Levemir and he was transitioned to SSIP.  The patient was transferred to the progressive care unit on 10/19/2020. He continued to struggle with shortness of breath due to anxiety at night and was requiring morphine to get a few hours of sleep. We continued a diuretic regimen for fluid overload. We discontinued his glucose checks since he was not a diabetic. Today, he is tolerating room air, his incisions are healing well, he is ambulating with limited issue and he is ready for discharge home.   Xarelto discussed with the patient. He states that he hasn't been on this medication for years and I confirmed this in Dr. Kathalene Frames note from this  admission. His stroke was back in 2013. Therefore, he will be discharged on ASA '325mg'$ .   Consults: None  Treatments: surgery:   Patient:  Harry Olson Pre-Op Dx: Three-vessel coronary artery disease.                         Hypertension. Post-op Dx: Same Procedure: CABG X 4, LIMA LAD, reverse saphenous vein graft to posterior lateral branch (Y graft), OM1, ramus intermedius, Endoscopic greater saphenous vein harvest on the left     Discharge Exam: Blood pressure 137/62, pulse 75, temperature 98.3 F (36.8 C), temperature source Oral, resp.  rate (!) 22, height '5\' 11"'$  (1.803 m), weight 96.3 kg, SpO2 100 %.  General appearance: alert, cooperative, and no distress Heart: regular rate and rhythm, S1, S2 normal, no murmur, click, rub or gallop Lungs: clear to auscultation bilaterally Abdomen: soft, non-tender; bowel sounds normal; no masses,  no organomegaly Extremities: extremities normal, atraumatic, no cyanosis or edema Wound: clean and dry   Discharge Medications:  The patient has been discharged on:   1.Beta Blocker:  Yes [ yes  ]                              No   [   ]                              If No, reason:  2.Ace Inhibitor/ARB: Yes [   ]                                     No  [  no  ]                                     If No, reason:  3.Statin:   Yes [  yes ]                  No  [   ]                  If No, reason:  4.Ecasa:  Yes  [ yes  ]                  No   [   ]                  If No, reason:    Discharge Instructions     Amb Referral to Cardiac Rehabilitation   Complete by: As directed    External referral sent to [place]   Diagnosis: CABG   CABG X ___: 4   After initial evaluation and assessments completed: Virtual Based Care may be provided alone or in conjunction with Phase 2 Cardiac Rehab based on patient barriers.: Yes      Allergies as of 10/21/2020   No Known Allergies      Medication List     STOP taking these medications    rivaroxaban 10 MG Tabs tablet Commonly known as: XARELTO       TAKE these medications    aspirin 325 MG EC tablet Take 1 tablet (325 mg total) by mouth daily.   atorvastatin 80 MG tablet Commonly  known as: LIPITOR Take 1 tablet (80 mg total) by mouth daily. What changed:  medication strength how much to take   hydrOXYzine 25 MG tablet Commonly known as: ATARAX/VISTARIL Take 25 mg by mouth 3 (three) times daily as needed for anxiety.   metoprolol tartrate 25 MG tablet Commonly known as: LOPRESSOR Take 1 tablet (25 mg total) by  mouth 2 (two) times daily.   oxyCODONE 5 MG immediate release tablet Commonly known as: Oxy IR/ROXICODONE Take 1 tablet (5 mg total) by mouth every 6 (six) hours as needed for severe pain.   polyethylene glycol 17 g packet Commonly known as: MIRALAX / GLYCOLAX Take 17 g by mouth daily as needed for moderate constipation.   sertraline 100 MG tablet Commonly known as: ZOLOFT Take 100 mg by mouth daily.   traZODone 100 MG tablet Commonly known as: DESYREL Take 200 mg by mouth at bedtime.        Follow-up Information     Loni Beckwith Neita Goodnight., MD Follow up.   Specialty: Cardiology Why: Contact office to set up follow up at time of discharge Contact information: [address] [place] [address] [phone]         Teodoro Kil, PA-C. Call today.   Specialty: Physician Assistant Contact information: [place] [address] [phone]         Lajuana Matte, MD Follow up.   Specialty: Cardiothoracic Surgery Why: Your virtual appointment is on 8/5 at 2pm. Your IN-PERSON appointment is on 8/19 at 9:40am. Contact information: [address] [place] [address] [phone]                 Signed: Nicholes Rough, PA-C 10/21/2020, 8:59 AM

## 2020-10-19 ENCOUNTER — Inpatient Hospital Stay (HOSPITAL_COMMUNITY): Payer: Medicare Other

## 2020-10-19 LAB — TYPE AND SCREEN
ABO/RH(D): O POS
Antibody Screen: NEGATIVE
Unit division: 0
Unit division: 0

## 2020-10-19 LAB — CBC
HCT: 27.5 % — ABNORMAL LOW (ref 39.0–52.0)
Hemoglobin: 9.2 g/dL — ABNORMAL LOW (ref 13.0–17.0)
MCH: 30.6 pg (ref 26.0–34.0)
MCHC: 33.5 g/dL (ref 30.0–36.0)
MCV: 91.4 fL (ref 80.0–100.0)
Platelets: 155 10*3/uL (ref 150–400)
RBC: 3.01 MIL/uL — ABNORMAL LOW (ref 4.22–5.81)
RDW: 12.7 % (ref 11.5–15.5)
WBC: 10.3 10*3/uL (ref 4.0–10.5)
nRBC: 0 % (ref 0.0–0.2)

## 2020-10-19 LAB — BPAM RBC
Blood Product Expiration Date: 202207242359
Blood Product Expiration Date: 202208202359
ISSUE DATE / TIME: 202207191157
ISSUE DATE / TIME: 202207221755
Unit Type and Rh: 5100
Unit Type and Rh: 5100

## 2020-10-19 LAB — BASIC METABOLIC PANEL
Anion gap: 13 (ref 5–15)
BUN: 17 mg/dL (ref 8–23)
CO2: 24 mmol/L (ref 22–32)
Calcium: 8.6 mg/dL — ABNORMAL LOW (ref 8.9–10.3)
Chloride: 101 mmol/L (ref 98–111)
Creatinine, Ser: 1.19 mg/dL (ref 0.61–1.24)
GFR, Estimated: >60 mL/min (ref >60)
Glucose, Bld: 141 mg/dL — ABNORMAL HIGH (ref 70–99)
Potassium: 3.8 mmol/L (ref 3.5–5.1)
Sodium: 138 mmol/L (ref 135–145)

## 2020-10-19 MED ORDER — MORPHINE SULFATE (PF) 2 MG/ML IV SOLN
1.0000 mg | Freq: Every evening | INTRAVENOUS | Status: DC | PRN
  Administered 2020-10-19: 1 mg via INTRAVENOUS
  Filled 2020-10-19: qty 1

## 2020-10-19 NOTE — Progress Notes (Signed)
Mobility Specialist: Progress Note   10/19/20 1246  Mobility  Activity Ambulated in hall  Level of Assistance Standby assist, set-up cues, supervision of patient - no hands on  Assistive Device Front wheel walker  Distance Ambulated (ft) 330 ft  Mobility Ambulated with assistance in hallway  Mobility Response Tolerated well  Mobility performed by Mobility specialist  $Mobility charge 1 Mobility   Pre-Mobility: 89 HR During Mobility: 122 HR Post-Mobility: 110 HR  Pt to BR and then agreeable to ambulate, void successful. Pt asx throughout ambulation. Pt back to BR after ambulation to attempt BM. Instructed pt to pull call string when he is done to be assisted back to the chair.   [facility] Harry Olson Mobility Specialist Mobility Specialist Phone: [phone]

## 2020-10-19 NOTE — Progress Notes (Addendum)
[address]       [place] [address]             [phone]      3 Days Post-Op Procedure(s) (LRB): CORONARY ARTERY BYPASS GRAFTING (CABG) X  FOUR  ON PUMP USING LEFT INTERNAL MAMMARY ARTERY AND RIGHT ENDOSCOPIC GREATER SAPHENOUS VEIN CONDUITS. (N/A) TRANSESOPHAGEAL ECHOCARDIOGRAM (TEE) (N/A) APPLICATION OF CELL SAVER ENDOVEIN HARVEST OF GREATER SAPHENOUS VEIN (Right) Subjective: Doing okay POD 3, Had some shortness of breath last night which made him anxious. Morphine helped.   Objective: Vital signs in last 24 hours: Temp:  [97.6 F (36.4 C)-98.6 F (37 C)] 97.8 F (36.6 C) (07/23 0420) Pulse Rate:  [25-95] 95 (07/23 0420) Cardiac Rhythm: Normal sinus rhythm (07/22 1905) Resp:  [15-29] 20 (07/23 0420) BP: (107-130)/(55-92) 129/92 (07/23 0420) SpO2:  [92 %-100 %] 96 % (07/23 0420) Weight:  [98.9 kg] 98.9 kg (07/23 0541)     Intake/Output from previous day: 07/22 0701 - 07/23 0700 In: 466.3 [P.O.:240; I.V.:26.3; IV Piggyback:200] Out: 900 [Urine:900] Intake/Output this shift: No intake/output data recorded.  General appearance: alert, cooperative, and no distress Heart: regular rate and rhythm, S1, S2 normal, no murmur, click, rub or gallop Lungs: clear to auscultation bilaterally Abdomen: soft, non-tender; bowel sounds normal; no masses,  no organomegaly Extremities: extremities normal, atraumatic, no cyanosis or edema Wound: clean and dry  Lab Results: Recent Labs    10/18/20 0418 10/19/20 0458  WBC 8.4 10.3  HGB 9.3* 9.2*  HCT 27.7* 27.5*  PLT 113* 155   BMET:  Recent Labs    10/18/20 0418 10/19/20 0458  NA 136 138  K 3.9 3.8  CL 105 101  CO2 27 24  GLUCOSE 137* 141*  BUN 14 17  CREATININE 1.20 1.19  CALCIUM 8.1* 8.6*    PT/INR:  Recent Labs    10/16/20 1507  LABPROT 16.0*  INR 1.3*   ABG    Component Value Date/Time   PHART 7.326 (L) 10/16/2020 2002   HCO3 22.2 10/16/2020 2002   TCO2 23 10/16/2020 2002    ACIDBASEDEF 3.0 (H) 10/16/2020 2002   O2SAT 93.0 10/16/2020 2002   CBG (last 3)  Recent Labs    10/18/20 0439 10/18/20 0647 10/18/20 1142  GLUCAP 130* 138* 127*    Assessment/Plan: S/P Procedure(s) (LRB): CORONARY ARTERY BYPASS GRAFTING (CABG) X  FOUR  ON PUMP USING LEFT INTERNAL MAMMARY ARTERY AND RIGHT ENDOSCOPIC GREATER SAPHENOUS VEIN CONDUITS. (N/A) TRANSESOPHAGEAL ECHOCARDIOGRAM (TEE) (N/A) APPLICATION OF CELL SAVER ENDOVEIN HARVEST OF GREATER SAPHENOUS VEIN (Right)    NSR, BP seems to have stabilized will continue to hold Lopressor until BP improves Pulm- CXR with atelectasis, small left pleural effusion, CT removed Renal- creatinine stable at 1.19, weight is up about 3kg- lasix x 3 ordered. Expected post operative blood loss anemia- Hgb at 9.3 CBGs controlled- patient is not a diabetic, discontinue blood glucose checks  Plan: Progressing well, continue diuresis, encouraged ambulation and use of incentive spirometer. Productive cough and offered mucinex, declined at this time.     LOS: 6 days    Elgie Collard 10/19/2020   Chart reviewed, patient examined, agree with above. He looks good overall for POD 3.  He had increased pain last night with shallow breathing and then felt SOB and anxious but received some morphine and was able to go to sleep and felt fine this am. He needs to take pain meds regularly to keep on top of  pain.

## 2020-10-19 NOTE — Progress Notes (Addendum)
CARDIAC REHAB PHASE I   [phone] Patient resting in bed upon arrival. Extremely sleepy and dozing off during conversation. Has walked with staff this am. Education completed on speaker phone with wife including review of Cardiac Surgery booklet, Risk factors, Sternal precautions, exercise guidelines, IS (patient demonstrated however only able to pull 1000), and phase 2 cardiac rehab. With patient permission referral placed to [facility]. Patient encouraged to ambulate 2 more times today. Wife provided instructions for watching D/C education video and states she will watch with patient prior to discharge.  Corney Knighton Minus Breeding RN, BSN

## 2020-10-19 NOTE — Progress Notes (Signed)
Patient assisted to chair after bath call bell within reach. Gait slightly unsteady. Alwaleed Obeso, Bettina Gavia RN

## 2020-10-20 MED ORDER — POTASSIUM CHLORIDE CRYS ER 20 MEQ PO TBCR
40.0000 meq | EXTENDED_RELEASE_TABLET | Freq: Once | ORAL | Status: AC
  Administered 2020-10-20: 40 meq via ORAL
  Filled 2020-10-20: qty 2

## 2020-10-20 MED ORDER — METOPROLOL TARTRATE 25 MG PO TABS
25.0000 mg | ORAL_TABLET | Freq: Two times a day (BID) | ORAL | Status: DC
  Administered 2020-10-20 – 2020-10-21 (×3): 25 mg via ORAL
  Filled 2020-10-20 (×3): qty 1

## 2020-10-20 NOTE — Progress Notes (Addendum)
      [address]       [place] [address]             [phone]      4 Days Post-Op Procedure(s) (LRB): CORONARY ARTERY BYPASS GRAFTING (CABG) X  FOUR  ON PUMP USING LEFT INTERNAL MAMMARY ARTERY AND RIGHT ENDOSCOPIC GREATER SAPHENOUS VEIN CONDUITS. (N/A) TRANSESOPHAGEAL ECHOCARDIOGRAM (TEE) (N/A) APPLICATION OF CELL SAVER ENDOVEIN HARVEST OF GREATER SAPHENOUS VEIN (Right) Subjective: Continues to have episodes of anxiety and shortness of breath, mostly at night, pain is no longer an issue.   Objective: Vital signs in last 24 hours: Temp:  [98 F (36.7 C)-99 F (37.2 C)] 98 F (36.7 C) (07/24 0410) Pulse Rate:  [89-97] 89 (07/24 0410) Cardiac Rhythm: Normal sinus rhythm (07/23 1900) Resp:  [18-20] 20 (07/24 0447) BP: (133-144)/(68-82) 133/68 (07/24 0410) SpO2:  [93 %-99 %] 93 % (07/24 0410) Weight:  [97.7 kg] 97.7 kg (07/24 0447)     Intake/Output from previous day: 07/23 0701 - 07/24 0700 In: 360 [P.O.:360] Out: 750 [Urine:750] Intake/Output this shift: No intake/output data recorded.  General appearance: alert, cooperative, and no distress Heart: regular rate and rhythm, S1, S2 normal, no murmur, click, rub or gallop Lungs: clear to auscultation bilaterally Abdomen: soft, non-tender; bowel sounds normal; no masses,  no organomegaly Extremities: extremities normal, atraumatic, no cyanosis or edema Wound: clean and dry  Lab Results: Recent Labs    10/18/20 0418 10/19/20 0458  WBC 8.4 10.3  HGB 9.3* 9.2*  HCT 27.7* 27.5*  PLT 113* 155   BMET:  Recent Labs    10/18/20 0418 10/19/20 0458  NA 136 138  K 3.9 3.8  CL 105 101  CO2 27 24  GLUCOSE 137* 141*  BUN 14 17  CREATININE 1.20 1.19  CALCIUM 8.1* 8.6*    PT/INR: No results for input(s): LABPROT, INR in the last 72 hours. ABG    Component Value Date/Time   PHART 7.326 (L) 10/16/2020 2002   HCO3 22.2 10/16/2020 2002   TCO2 23 10/16/2020 2002   ACIDBASEDEF 3.0 (H) 10/16/2020  2002   O2SAT 93.0 10/16/2020 2002   CBG (last 3)  Recent Labs    10/18/20 0439 10/18/20 0647 10/18/20 1142  GLUCAP 130* 138* 127*    Assessment/Plan: S/P Procedure(s) (LRB): CORONARY ARTERY BYPASS GRAFTING (CABG) X  FOUR  ON PUMP USING LEFT INTERNAL MAMMARY ARTERY AND RIGHT ENDOSCOPIC GREATER SAPHENOUS VEIN CONDUITS. (N/A) TRANSESOPHAGEAL ECHOCARDIOGRAM (TEE) (N/A) APPLICATION OF CELL SAVER ENDOVEIN HARVEST OF GREATER SAPHENOUS VEIN (Right)    NSR, BP seems to have stabilized will continue to hold Lopressor until BP improves Pulm- CXR with atelectasis, small left pleural effusion, CT removed Renal- creatinine stable at 1.19, weight is up about 2kg- lasix x 3 ordered. Expected post operative blood loss anemia- Hgb at 9.3 CBGs controlled- patient is not a diabetic, discontinue blood glucose checks Needs better pain control especially at night. He remains on morphine at night due to pain, anxiety, SOB   Plan: Progressing well, continue diuresis, encouraged ambulation. Hopefully home tomorrow.    LOS: 7 days    Elgie Collard 10/20/2020   Chart reviewed, patient examined, agree with above. He feels better today. Sinus rhythm with some PVC's, bigeminy and 14 beat run of NSVT. He has not been started on BB yet due to lower BP but it is now fine so will start Lopressor. K+ 3.8 so will give him some. Mg2+ normal.

## 2020-10-20 NOTE — Progress Notes (Signed)
Mobility Specialist: Progress Note   10/20/20 1233  Mobility  Activity Ambulated in hall  Level of Assistance Modified independent, requires aide device or extra time  Assistive Device Front wheel walker  Distance Ambulated (ft) 360 ft  Mobility Ambulated with assistance in hallway  Mobility Response Tolerated well  Mobility performed by Mobility specialist  $Mobility charge 1 Mobility   Pre-Mobility: 99 HR During Mobility: 124 HR Post-Mobility: 98 HR  Pt asx throughout ambulation. Pt back to bed after walk with pt's wife present in the room.   [facility] Alaiah Lundy Mobility Specialist Mobility Specialist Phone: [phone]

## 2020-10-20 NOTE — Progress Notes (Addendum)
CCMD called regarding Patient with 14 beats of V Tach on monitor. Stated Patient also with some bigeminy PVCs and pairs of pvcs prior to 14 beats. When assessed patient was coming out of restroom. Asymptomatic  Assisted back to bed call bell with in reach. Josephene Marrone, Bettina Gavia RN

## 2020-10-21 MED ORDER — ATORVASTATIN CALCIUM 80 MG PO TABS: 80.0000 mg | ORAL_TABLET | Freq: Every day | ORAL | 1 refills | Status: AC

## 2020-10-21 MED ORDER — ASPIRIN 325 MG PO TBEC: 325.0000 mg | DELAYED_RELEASE_TABLET | Freq: Every day | ORAL | 0 refills | Status: AC

## 2020-10-21 MED ORDER — ASPIRIN EC 81 MG PO TBEC: 81.0000 mg | DELAYED_RELEASE_TABLET | Freq: Every day | ORAL | 2 refills | Status: DC

## 2020-10-21 MED ORDER — OXYCODONE HCL 5 MG PO TABS: 5.0000 mg | ORAL_TABLET | Freq: Four times a day (QID) | ORAL | 0 refills | Status: DC | PRN

## 2020-10-21 MED ORDER — METOPROLOL TARTRATE 25 MG PO TABS: 25.0000 mg | ORAL_TABLET | Freq: Two times a day (BID) | ORAL | 1 refills | Status: AC

## 2020-10-21 NOTE — Plan of Care (Signed)
  Problem: Education: Goal: Knowledge of General Education information will improve Description: Including pain rating scale, medication(s)/side effects and non-pharmacologic comfort measures Outcome: Adequate for Discharge   

## 2020-10-21 NOTE — Progress Notes (Signed)
      [address]       [place] [address]             [phone]      5 Days Post-Op Procedure(s) (LRB): CORONARY ARTERY BYPASS GRAFTING (CABG) X  FOUR  ON PUMP USING LEFT INTERNAL MAMMARY ARTERY AND RIGHT ENDOSCOPIC GREATER SAPHENOUS VEIN CONDUITS. (N/A) TRANSESOPHAGEAL ECHOCARDIOGRAM (TEE) (N/A) APPLICATION OF CELL SAVER ENDOVEIN HARVEST OF GREATER SAPHENOUS VEIN (Right) Subjective: Feels good this morning, was able to sleep well and wants to go home.  Objective: Vital signs in last 24 hours: Temp:  [98.4 F (36.9 C)-98.9 F (37.2 C)] 98.4 F (36.9 C) (07/25 0451) Pulse Rate:  [75-87] 81 (07/25 0451) Cardiac Rhythm: Normal sinus rhythm (07/24 1900) Resp:  [18-20] 20 (07/25 0451) BP: (128-145)/(66-81) 143/66 (07/25 0451) SpO2:  [90 %-100 %] 100 % (07/25 0451) Weight:  [96.3 kg] 96.3 kg (07/25 0545)     Intake/Output from previous day: 07/24 0701 - 07/25 0700 In: 120 [P.O.:120] Out: 300 [Urine:300] Intake/Output this shift: No intake/output data recorded.  General appearance: alert, cooperative, and no distress Heart: regular rate and rhythm, S1, S2 normal, no murmur, click, rub or gallop Lungs: clear to auscultation bilaterally Abdomen: soft, non-tender; bowel sounds normal; no masses,  no organomegaly Extremities: extremities normal, atraumatic, no cyanosis or edema Wound: clean and dry  Lab Results: Recent Labs    10/19/20 0458  WBC 10.3  HGB 9.2*  HCT 27.5*  PLT 155   BMET:  Recent Labs    10/19/20 0458  NA 138  K 3.8  CL 101  CO2 24  GLUCOSE 141*  BUN 17  CREATININE 1.19  CALCIUM 8.6*    PT/INR: No results for input(s): LABPROT, INR in the last 72 hours. ABG    Component Value Date/Time   PHART 7.326 (L) 10/16/2020 2002   HCO3 22.2 10/16/2020 2002   TCO2 23 10/16/2020 2002   ACIDBASEDEF 3.0 (H) 10/16/2020 2002   O2SAT 93.0 10/16/2020 2002   CBG (last 3)  Recent Labs    10/18/20 1142  GLUCAP 127*     Assessment/Plan: S/P Procedure(s) (LRB): CORONARY ARTERY BYPASS GRAFTING (CABG) X  FOUR  ON PUMP USING LEFT INTERNAL MAMMARY ARTERY AND RIGHT ENDOSCOPIC GREATER SAPHENOUS VEIN CONDUITS. (N/A) TRANSESOPHAGEAL ECHOCARDIOGRAM (TEE) (N/A) APPLICATION OF CELL SAVER ENDOVEIN HARVEST OF GREATER SAPHENOUS VEIN (Right)  NSR, BP seems to have stabilized, restarted lopressor. Pulm- CXR with atelectasis, small left pleural effusion, CT removed Renal- creatinine stable at 1.19, weight is up about 1kg- should not need lasix for home.  Expected post operative blood loss anemia- Hgb at 9.2 CBGs controlled- patient is not a diabetic, discontinue blood glucose checks Needs better pain control especially at night. He remains on morphine at night due to pain, anxiety, SOB  Plan: Feeling better this morning and wants to go home. Will arrange for discharge today. Instructions reviewed with the patient and all questions answered.    LOS: 8 days    Elgie Collard 10/21/2020

## 2020-10-21 NOTE — Progress Notes (Signed)
Mobility Specialist: Progress Note   10/21/20 1056  Mobility  Activity  (Assisted pt in getting ready for d/c)  Level of Assistance Contact guard assist, steadying assist  Assistive Device None  Distance Ambulated (ft) 2 ft  Mobility Out of bed to chair with meals  Mobility Response Tolerated well  Mobility performed by Mobility specialist   Pt attempted to stand to put his shorts on upon entering the room and fell back into chair when he attempted to stand. Pt assisted with balance while getting dressed and transferred back to the bed. Pt is sitting EOB with pt's wife present in the room. Instructed pt to call out if he needs help with anything else.  [facility] Lillie Bollig Mobility Specialist Mobility Specialist Phone: [phone]

## 2020-10-21 NOTE — Progress Notes (Signed)
CARDIAC REHAB PHASE I   PRE:  Rate/Rhythm: 86 SR    BP: lying 137/62    SaO2: 91-93 RA  MODE:  Ambulation: 390 ft   POST:  Rate/Rhythm: 112 ST    BP: sitting 154/79     SaO2: 91 RA   Pt groggy this am. Moved out of bed and to BR independently. SOB with distance, HR elevated. SaO2 lower than Friday. Discussed pt staying out of bed most of day. He is using IS, 1250 ml. Reviewed ed, pt receptive. [place], ACSM 10/21/2020 8:39 AM

## 2020-10-21 NOTE — Progress Notes (Signed)
Discharge instructions (including medications) discussed with and copy provided to patient/caregiver 

## 2020-10-22 ENCOUNTER — Other Ambulatory Visit: Payer: Self-pay | Admitting: Physician Assistant

## 2020-10-22 ENCOUNTER — Telehealth: Payer: Self-pay

## 2020-10-22 MED ORDER — OXYCODONE HCL 5 MG PO TABS: 5.0000 mg | ORAL_TABLET | Freq: Four times a day (QID) | ORAL | 0 refills | Status: DC | PRN

## 2020-10-22 NOTE — Telephone Encounter (Signed)
Patient contacted the on call physician in regards to vomiting. He is s/p CABG with Dr. Kipp Brood and was discharged from the hospital yesterday, 10/21/20. He stated that it was determined that his GERD was the culprit and has since re-started medication to help, which he stated has improved since yesterday. Also, he stated that all his RX's from the hospital were sent to the incorrect pharmacy and that he had all of them transferred to the [facility] on [address], but was unable to transfer his pain prescription. He has not picked up the prescription and was confirmed on [employer].  Spoke with Lars Pinks, PA whom stated she cancelled the RX from incorrect pharmacy, [facility] [address], to [facility] on [address] per patient's request.   Patient aware and has picked up RX.

## 2020-10-24 MED FILL — Sodium Bicarbonate IV Soln 8.4%: INTRAVENOUS | Qty: 50 | Status: AC

## 2020-10-24 MED FILL — Heparin Sodium (Porcine) Inj 1000 Unit/ML: INTRAMUSCULAR | Qty: 60 | Status: AC

## 2020-10-24 MED FILL — Heparin Sodium (Porcine) Inj 1000 Unit/ML: INTRAMUSCULAR | Qty: 20 | Status: AC

## 2020-10-24 MED FILL — Sodium Chloride IV Soln 0.9%: INTRAVENOUS | Qty: 2000 | Status: AC

## 2020-10-24 MED FILL — Mannitol IV Soln 20%: INTRAVENOUS | Qty: 500 | Status: AC

## 2020-10-24 MED FILL — Calcium Chloride Inj 10%: INTRAVENOUS | Qty: 10 | Status: AC

## 2020-10-24 MED FILL — Electrolyte-R (PH 7.4) Solution: INTRAVENOUS | Qty: 3000 | Status: AC

## 2020-10-25 ENCOUNTER — Telehealth (HOSPITAL_COMMUNITY): Payer: Self-pay

## 2020-10-25 NOTE — Telephone Encounter (Signed)
Per phase I cardiac rehab, fax cardiac rehab referral to [facility]. 

## 2020-10-26 ENCOUNTER — Telehealth: Payer: Self-pay | Admitting: Thoracic Surgery (Cardiothoracic Vascular Surgery)

## 2020-10-26 MED ORDER — ESOMEPRAZOLE MAGNESIUM 40 MG PO PACK: 40.0000 mg | PACK | Freq: Every day | ORAL | 0 refills | Status: DC

## 2020-10-26 NOTE — Telephone Encounter (Signed)
Patient called to c/o acid reflux  Had a CABG 10/16/2020 by Dr. Kipp Brood and was discharged 7/25.  Since dc has been having severe acid reflux. Accompanied by N/V for first several days, but did not call. Last night was unable to sleep due to reflux. Also hiccups x 24 hours. Also c/o abdominal distention.  Offered prescription for Nexium for reflux, with which he is agreeable. Prescription sent to [facility] pharmacy on [address] at pateint's request.  I strongly recommended he go to ED for evaluation given his abdominal distention and hiccups, but he wants to try Nexium first.  Remo Lipps C. Roxan Hockey, MD [facility] [phone]

## 2020-11-01 ENCOUNTER — Other Ambulatory Visit: Payer: Self-pay

## 2020-11-01 ENCOUNTER — Telehealth (INDEPENDENT_AMBULATORY_CARE_PROVIDER_SITE_OTHER): Payer: Self-pay | Admitting: Thoracic Surgery (Cardiothoracic Vascular Surgery)

## 2020-11-01 DIAGNOSIS — Z951 Presence of aortocoronary bypass graft: Secondary | ICD-10-CM

## 2020-11-01 NOTE — Progress Notes (Signed)
     [address]       [place], [address]             [phone]       Patient: Home Provider: Office Consent for Telemedicine visit obtained.  Today's visit was completed via a real-time telehealth (see specific modality noted below). The patient/authorized person provided oral consent at the time of the visit to engage in a telemedicine encounter with the present provider at [facility]. The patient/authorized person was informed of the potential benefits, limitations, and risks of telemedicine. The patient/authorized person expressed understanding that the laws that protect confidentiality also apply to telemedicine. The patient/authorized person acknowledged understanding that telemedicine does not provide emergency services and that he or she would need to call 911 or proceed to the nearest hospital for help if such a need arose.   Total time spent in the clinical discussion 10 minutes.  Telehealth Modality: Phone visit (audio only)  I had a telephone visit with Mr. Vollbrecht.  S/p CABG.  Overall doing well.  He is not taking any pain medication anymore.  His reflux has improved. He is using a stationary bike for exercise.  He only complains of heel pain and thinks that this may be due to his gout.   Pt will follow-up in 1 month with a CXR.  Addison Whidbee Bary Leriche

## 2020-11-02 ENCOUNTER — Other Ambulatory Visit: Payer: Self-pay

## 2020-11-02 ENCOUNTER — Encounter (HOSPITAL_BASED_OUTPATIENT_CLINIC_OR_DEPARTMENT_OTHER): Payer: Self-pay

## 2020-11-02 ENCOUNTER — Emergency Department (HOSPITAL_BASED_OUTPATIENT_CLINIC_OR_DEPARTMENT_OTHER): Payer: Medicare Other

## 2020-11-02 ENCOUNTER — Emergency Department (HOSPITAL_BASED_OUTPATIENT_CLINIC_OR_DEPARTMENT_OTHER)
Admission: EM | Admit: 2020-11-02 | Discharge: 2020-11-02 | Disposition: A | Discharge status: Home / Self Care | Payer: Medicare Other | Attending: Emergency Medicine | Admitting: Emergency Medicine

## 2020-11-02 DIAGNOSIS — Z951 Presence of aortocoronary bypass graft: Secondary | ICD-10-CM | POA: Insufficient documentation

## 2020-11-02 DIAGNOSIS — I10 Essential (primary) hypertension: Secondary | ICD-10-CM | POA: Insufficient documentation

## 2020-11-02 DIAGNOSIS — Z85828 Personal history of other malignant neoplasm of skin: Secondary | ICD-10-CM | POA: Insufficient documentation

## 2020-11-02 DIAGNOSIS — M79671 Pain in right foot: Secondary | ICD-10-CM | POA: Diagnosis not present

## 2020-11-02 DIAGNOSIS — Z96652 Presence of left artificial knee joint: Secondary | ICD-10-CM | POA: Diagnosis not present

## 2020-11-02 DIAGNOSIS — Z7982 Long term (current) use of aspirin: Secondary | ICD-10-CM | POA: Diagnosis not present

## 2020-11-02 DIAGNOSIS — Z79899 Other long term (current) drug therapy: Secondary | ICD-10-CM | POA: Diagnosis not present

## 2020-11-02 DIAGNOSIS — M79672 Pain in left foot: Secondary | ICD-10-CM | POA: Diagnosis not present

## 2020-11-02 MED ORDER — PREDNISONE 10 MG PO TABS: 20.0000 mg | ORAL_TABLET | Freq: Every day | ORAL | 0 refills | Status: DC

## 2020-11-02 MED ORDER — DEXAMETHASONE SODIUM PHOSPHATE 10 MG/ML IJ SOLN
10.0000 mg | Freq: Once | INTRAMUSCULAR | Status: AC
  Administered 2020-11-02: 10 mg via INTRAMUSCULAR
  Filled 2020-11-02: qty 1
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
## 2020-11-02 NOTE — ED Triage Notes (Signed)
18 days ago pt had quadruple bypass. States before surgery he started having an episode of gout, improved after surgery. States 5 days ago started having bilateral heel pain. States feels similar to gout flares. Been taking colchicine without relief.

## 2020-11-02 NOTE — Discharge Instructions (Addendum)
Take 40 mg of prednisone for the next 5 days.  This should improve the pain in your feet.  Although there were findings of gout in the left first toe, right foot did show a calcaneal spur on the heel.  This could be contributing to the pain you are having there.  You should follow-up with podiatry when you are able to given that the heel pain does not sound like classic gout.  That said, prednisone will also help your gout if that is the cause of the pain.

## 2020-11-13 ENCOUNTER — Other Ambulatory Visit: Payer: Self-pay | Admitting: Thoracic Surgery (Cardiothoracic Vascular Surgery)

## 2020-11-13 DIAGNOSIS — Z951 Presence of aortocoronary bypass graft: Secondary | ICD-10-CM

## 2020-11-15 ENCOUNTER — Ambulatory Visit
Admission: RE | Admit: 2020-11-15 | Discharge: 2020-11-15 | Disposition: A | Discharge status: Home / Self Care | Payer: Medicare Other | Source: Ambulatory Visit | Attending: Thoracic Surgery (Cardiothoracic Vascular Surgery) | Admitting: Thoracic Surgery (Cardiothoracic Vascular Surgery)

## 2020-11-15 ENCOUNTER — Other Ambulatory Visit: Payer: Self-pay

## 2020-11-15 ENCOUNTER — Ambulatory Visit (INDEPENDENT_AMBULATORY_CARE_PROVIDER_SITE_OTHER): Payer: Self-pay | Admitting: Thoracic Surgery (Cardiothoracic Vascular Surgery)

## 2020-11-15 VITALS — BP 120/67 | HR 75 | Resp 20 | Ht 71.0 in | Wt 203.0 lb

## 2020-11-15 DIAGNOSIS — Z951 Presence of aortocoronary bypass graft: Secondary | ICD-10-CM

## 2020-11-15 NOTE — Progress Notes (Signed)
      [address]       [place] [address]             [phone]        [place] H Baldridge [place] Medical Record [id number] Date of Birth: 1950/05/29  Referring: Jaci Lazier* Primary Care: Teodoro Kil, PA-C Primary Cardiologist:None  Reason for visit:   follow-up  History of Present Illness:     Harry Olson comes in for his 1 month follow-up appointment.  Overall he is doing quite well.  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  He only complains of some right hand pain focused mostly in his pinky and ring finger.  There is also some tenderness along his medial elbow.  Physical Exam: BP 120/67   Pulse 75   Resp 20   Ht '5\' 11"'$  (1.803 m)   Wt 203 lb (92.1 kg)   SpO2 95% Comment: RA  BMI 28.31 kg/m   Alert NAD Incision clean.  Sternum stable Abdomen soft, ND No peripheral edema   Diagnostic Studies & Laboratory data: CXR: Clear     Assessment / Plan:   70 year old male status post CABG currently doing well.  He does have some pain along the ulnar distribution of his right hand.  I do not think this is related to the radial arterial line.  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  I have made a referral for him to meet one of our spine surgeons for further evaluation.  I will also place another referral for outpatient cardiac rehab.  Follow-up as needed.   Lajuana Matte 11/15/2020 5:09 PM

## 2021-01-21 ENCOUNTER — Other Ambulatory Visit: Payer: Self-pay | Admitting: Physician Assistant

## 2021-02-10 ENCOUNTER — Telehealth: Payer: Self-pay

## 2021-02-10 NOTE — Telephone Encounter (Signed)
Patient contacted the office concerned about chest/"bone" pain that he has started to feel. States that it really hurts anytime he coughs, sneezes, or breathes. He is s/p CABG x4 with Dr. Kipp Brood 09/2020 and was since released from our office. Stated that he has been pretty "aggressive" with activity and has been riding his motorcycle and getting back into the gym. Advised that he could come in 02/28/21 at 1:50p to see Dr. Kipp Brood with chest xray to evaluate sternum. He acknowledged receipt.

## 2021-02-17 ENCOUNTER — Other Ambulatory Visit: Payer: Self-pay | Admitting: Thoracic Surgery (Cardiothoracic Vascular Surgery)

## 2021-02-17 DIAGNOSIS — R079 Chest pain, unspecified: Secondary | ICD-10-CM

## 2021-02-17 DIAGNOSIS — Z951 Presence of aortocoronary bypass graft: Secondary | ICD-10-CM

## 2021-02-19 ENCOUNTER — Ambulatory Visit (HOSPITAL_BASED_OUTPATIENT_CLINIC_OR_DEPARTMENT_OTHER): Payer: Medicare Other

## 2021-02-22 ENCOUNTER — Other Ambulatory Visit: Payer: Self-pay | Admitting: Physician Assistant

## 2021-02-25 ENCOUNTER — Other Ambulatory Visit: Payer: Self-pay | Admitting: Thoracic Surgery (Cardiothoracic Vascular Surgery)

## 2021-02-25 DIAGNOSIS — Z951 Presence of aortocoronary bypass graft: Secondary | ICD-10-CM

## 2021-02-27 ENCOUNTER — Ambulatory Visit (HOSPITAL_BASED_OUTPATIENT_CLINIC_OR_DEPARTMENT_OTHER)
Admission: RE | Admit: 2021-02-27 | Discharge: 2021-02-27 | Disposition: A | Discharge status: Home / Self Care | Payer: Medicare Other | Source: Ambulatory Visit | Attending: Thoracic Surgery (Cardiothoracic Vascular Surgery) | Admitting: Thoracic Surgery (Cardiothoracic Vascular Surgery)

## 2021-02-27 ENCOUNTER — Other Ambulatory Visit: Payer: Self-pay

## 2021-02-27 DIAGNOSIS — Z951 Presence of aortocoronary bypass graft: Secondary | ICD-10-CM | POA: Diagnosis not present

## 2021-02-27 DIAGNOSIS — R079 Chest pain, unspecified: Secondary | ICD-10-CM | POA: Insufficient documentation

## 2021-02-28 ENCOUNTER — Ambulatory Visit (INDEPENDENT_AMBULATORY_CARE_PROVIDER_SITE_OTHER): Payer: Medicare Other | Admitting: Thoracic Surgery (Cardiothoracic Vascular Surgery)

## 2021-02-28 VITALS — BP 160/87 | HR 78 | Resp 20 | Ht 71.0 in | Wt 216.0 lb

## 2021-02-28 DIAGNOSIS — Z951 Presence of aortocoronary bypass graft: Secondary | ICD-10-CM | POA: Diagnosis not present

## 2021-02-28 DIAGNOSIS — R079 Chest pain, unspecified: Secondary | ICD-10-CM | POA: Diagnosis not present

## 2021-02-28 NOTE — Progress Notes (Signed)
      [address]       [place] [address]             [phone]        [place] H [place] Medical Record #[id number] Date of Birth: 1950-07-19  Referring: Jaci Lazier* Primary Care: Teodoro Kil, PA-C Primary Cardiologist:None  Reason for visit:   follow-up  History of Present Illness:     70 year old patient's status post coronary artery bypass grafting July 2022 comes in with complaint of sternal discomfort.  He states that he occasionally feels his sternum clicking.  He does admit to going back to work out fairly early in his recovery.  And also riding his motorcycle.  The pain is worse now that it is cold.  Physical Exam: BP (!) 160/87   Pulse 78   Resp 20   Ht 5\' 11"  (1.803 m)   Wt 216 lb (98 kg)   SpO2 95% Comment: RA  BMI 30.13 kg/m   Alert NAD Incision clean.  Sternum for the most part feels stable.  I was not able to elicit any clicking with coughing. 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  All wires are intact.  We discussed the possibility of rewiring his sternum but the patient does not want to do that at this point.  He would like to wait until the weather gets warmer to assess his symptoms.  He will follow-up sometime in the spring to discuss further plans.   Lajuana Matte 02/28/2021 5:22 PM

## 2021-03-09 ENCOUNTER — Emergency Department (HOSPITAL_BASED_OUTPATIENT_CLINIC_OR_DEPARTMENT_OTHER): Payer: Medicare Other

## 2021-03-09 ENCOUNTER — Other Ambulatory Visit: Payer: Self-pay

## 2021-03-09 ENCOUNTER — Encounter (HOSPITAL_BASED_OUTPATIENT_CLINIC_OR_DEPARTMENT_OTHER): Payer: Self-pay | Admitting: *Deleted

## 2021-03-09 ENCOUNTER — Emergency Department (HOSPITAL_BASED_OUTPATIENT_CLINIC_OR_DEPARTMENT_OTHER)
Admission: EM | Admit: 2021-03-09 | Discharge: 2021-03-10 | Disposition: A | Discharge status: Home / Self Care | Payer: Medicare Other | Attending: Emergency Medicine | Admitting: Emergency Medicine

## 2021-03-09 DIAGNOSIS — Z85820 Personal history of malignant melanoma of skin: Secondary | ICD-10-CM | POA: Insufficient documentation

## 2021-03-09 DIAGNOSIS — Z96652 Presence of left artificial knee joint: Secondary | ICD-10-CM | POA: Diagnosis not present

## 2021-03-09 DIAGNOSIS — Z955 Presence of coronary angioplasty implant and graft: Secondary | ICD-10-CM | POA: Diagnosis not present

## 2021-03-09 DIAGNOSIS — R0602 Shortness of breath: Secondary | ICD-10-CM | POA: Insufficient documentation

## 2021-03-09 DIAGNOSIS — Z20822 Contact with and (suspected) exposure to covid-19: Secondary | ICD-10-CM | POA: Insufficient documentation

## 2021-03-09 DIAGNOSIS — R059 Cough, unspecified: Secondary | ICD-10-CM | POA: Insufficient documentation

## 2021-03-09 DIAGNOSIS — I4581 Long QT syndrome: Secondary | ICD-10-CM | POA: Diagnosis not present

## 2021-03-09 DIAGNOSIS — I2510 Atherosclerotic heart disease of native coronary artery without angina pectoris: Secondary | ICD-10-CM | POA: Insufficient documentation

## 2021-03-09 DIAGNOSIS — I10 Essential (primary) hypertension: Secondary | ICD-10-CM | POA: Insufficient documentation

## 2021-03-09 DIAGNOSIS — R0981 Nasal congestion: Secondary | ICD-10-CM | POA: Insufficient documentation

## 2021-03-09 DIAGNOSIS — R9431 Abnormal electrocardiogram [ECG] [EKG]: Secondary | ICD-10-CM

## 2021-03-09 HISTORY — DX: Atherosclerotic heart disease of native coronary artery without angina pectoris: I25.10

## 2021-03-09 LAB — TROPONIN I (HIGH SENSITIVITY): Troponin I (High Sensitivity): 5 ng/L (ref <18)

## 2021-03-09 LAB — CBC
HCT: 42.3 % (ref 39.0–52.0)
Hemoglobin: 14.9 g/dL (ref 13.0–17.0)
MCH: 29.2 pg (ref 26.0–34.0)
MCHC: 35.2 g/dL (ref 30.0–36.0)
MCV: 82.8 fL (ref 80.0–100.0)
Platelets: 281 10*3/uL (ref 150–400)
RBC: 5.11 MIL/uL (ref 4.22–5.81)
RDW: 13.3 % (ref 11.5–15.5)
WBC: 8.1 10*3/uL (ref 4.0–10.5)
nRBC: 0 % (ref 0.0–0.2)

## 2021-03-09 LAB — BASIC METABOLIC PANEL
Anion gap: 11 (ref 5–15)
BUN: 20 mg/dL (ref 8–23)
CO2: 25 mmol/L (ref 22–32)
Calcium: 9.5 mg/dL (ref 8.9–10.3)
Chloride: 103 mmol/L (ref 98–111)
Creatinine, Ser: 1.14 mg/dL (ref 0.61–1.24)
GFR, Estimated: >60 mL/min (ref >60)
Glucose, Bld: 123 mg/dL — ABNORMAL HIGH (ref 70–99)
Potassium: 3.1 mmol/L — ABNORMAL LOW (ref 3.5–5.1)
Sodium: 139 mmol/L (ref 135–145)

## 2021-03-09 LAB — RESP PANEL BY RT-PCR (FLU A&B, COVID) ARPGX2
Influenza A by PCR: NEGATIVE
Influenza B by PCR: NEGATIVE
SARS Coronavirus 2 by RT PCR: NEGATIVE

## 2021-03-09 LAB — BRAIN NATRIURETIC PEPTIDE: B Natriuretic Peptide: 54.5 pg/mL (ref 0.0–100.0)

## 2021-03-09 MED ORDER — IOHEXOL 350 MG/ML SOLN
100.0000 mL | Freq: Once | INTRAVENOUS | Status: AC | PRN
  Administered 2021-03-09: 100 mL via INTRAVENOUS
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
## 2021-03-09 NOTE — ED Notes (Signed)
ED Provider at bedside. 

## 2021-03-09 NOTE — ED Notes (Signed)
PA Gerald Stabs in triage with pt for [facility]

## 2021-03-09 NOTE — ED Triage Notes (Signed)
Pt states he had ulnar nerve procedure on 12/8. Since anesthesia he has had increased SOB. He states he has had a cough x 10 days and he feels very SOB. He also had a CABG in July of this year

## 2021-03-10 ENCOUNTER — Other Ambulatory Visit (HOSPITAL_BASED_OUTPATIENT_CLINIC_OR_DEPARTMENT_OTHER): Payer: Self-pay

## 2021-03-10 LAB — TROPONIN I (HIGH SENSITIVITY): Troponin I (High Sensitivity): 5 ng/L (ref <18)

## 2021-03-10 MED ORDER — DOXYCYCLINE HYCLATE 100 MG PO CAPS: 100.0000 mg | ORAL_CAPSULE | Freq: Two times a day (BID) | ORAL | 0 refills | Status: AC

## 2021-03-10 MED ORDER — GUAIFENESIN-CODEINE 100-10 MG/5ML PO SOLN
ORAL | 0 refills | Status: DC
  Filled 2021-03-10: qty 120, 8d supply, fill #0

## 2021-03-10 MED ORDER — GUAIFENESIN-CODEINE 100-10 MG/5ML PO SYRP: 5.0000 mL | ORAL_SOLUTION | Freq: Three times a day (TID) | ORAL | 0 refills | Status: DC | PRN

## 2021-03-10 NOTE — Discharge Instructions (Signed)
You were seen in the emergency room today with cough and congestion symptoms.  Your CT scan did not show blood clots or pneumonia.  There is no fluid on your lungs.  Your heart enzymes are normal.  I am starting you on a short course of antibiotic along with some cough medication.  Please only take the cough medication as needed for severe coughing.  I would like for you to call your cardiology doctor tomorrow to schedule follow-up appointment.  If you develop chest pain, worsening shortness of breath, other sudden/severe symptoms you should return to the emergency department for reevaluation.

## 2021-05-30 ENCOUNTER — Other Ambulatory Visit: Payer: Self-pay

## 2021-05-30 ENCOUNTER — Ambulatory Visit (INDEPENDENT_AMBULATORY_CARE_PROVIDER_SITE_OTHER): Payer: Medicare Other | Admitting: Thoracic Surgery (Cardiothoracic Vascular Surgery)

## 2021-05-30 VITALS — BP 153/93 | HR 86 | Resp 20 | Ht 71.0 in | Wt 216.0 lb

## 2021-05-30 DIAGNOSIS — R079 Chest pain, unspecified: Secondary | ICD-10-CM | POA: Diagnosis not present

## 2021-05-30 DIAGNOSIS — Z951 Presence of aortocoronary bypass graft: Secondary | ICD-10-CM
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
## 2021-10-09 ENCOUNTER — Emergency Department (HOSPITAL_BASED_OUTPATIENT_CLINIC_OR_DEPARTMENT_OTHER)
Admission: EM | Admit: 2021-10-09 | Discharge: 2021-10-09 | Disposition: A | Discharge status: Home / Self Care | Payer: Medicare Other | Attending: Emergency Medicine | Admitting: Emergency Medicine

## 2021-10-09 ENCOUNTER — Other Ambulatory Visit: Payer: Self-pay

## 2021-10-09 ENCOUNTER — Encounter (HOSPITAL_BASED_OUTPATIENT_CLINIC_OR_DEPARTMENT_OTHER): Payer: Self-pay | Admitting: Emergency Medicine

## 2021-10-09 ENCOUNTER — Emergency Department (HOSPITAL_BASED_OUTPATIENT_CLINIC_OR_DEPARTMENT_OTHER): Payer: Medicare Other

## 2021-10-09 DIAGNOSIS — Z7902 Long term (current) use of antithrombotics/antiplatelets: Secondary | ICD-10-CM | POA: Diagnosis not present

## 2021-10-09 DIAGNOSIS — Z7982 Long term (current) use of aspirin: Secondary | ICD-10-CM | POA: Insufficient documentation

## 2021-10-09 DIAGNOSIS — I10 Essential (primary) hypertension: Secondary | ICD-10-CM | POA: Insufficient documentation

## 2021-10-09 DIAGNOSIS — R079 Chest pain, unspecified: Secondary | ICD-10-CM | POA: Diagnosis present

## 2021-10-09 DIAGNOSIS — Z951 Presence of aortocoronary bypass graft: Secondary | ICD-10-CM | POA: Insufficient documentation

## 2021-10-09 DIAGNOSIS — Z8673 Personal history of transient ischemic attack (TIA), and cerebral infarction without residual deficits: Secondary | ICD-10-CM | POA: Insufficient documentation

## 2021-10-09 DIAGNOSIS — Z79899 Other long term (current) drug therapy: Secondary | ICD-10-CM | POA: Insufficient documentation

## 2021-10-09 LAB — TROPONIN I (HIGH SENSITIVITY)
Troponin I (High Sensitivity): 4 ng/L (ref <18)
Troponin I (High Sensitivity): 3 ng/L (ref <18)

## 2021-10-09 LAB — CBC
HCT: 41.9 % (ref 39.0–52.0)
Hemoglobin: 14.6 g/dL (ref 13.0–17.0)
MCH: 29.9 pg (ref 26.0–34.0)
MCHC: 34.8 g/dL (ref 30.0–36.0)
MCV: 85.9 fL (ref 80.0–100.0)
Platelets: 186 10*3/uL (ref 150–400)
RBC: 4.88 MIL/uL (ref 4.22–5.81)
RDW: 12.8 % (ref 11.5–15.5)
WBC: 9.8 10*3/uL (ref 4.0–10.5)
nRBC: 0 % (ref 0.0–0.2)

## 2021-10-09 LAB — BASIC METABOLIC PANEL
Anion gap: 11 (ref 5–15)
BUN: 16 mg/dL (ref 8–23)
CO2: 22 mmol/L (ref 22–32)
Calcium: 9.3 mg/dL (ref 8.9–10.3)
Chloride: 106 mmol/L (ref 98–111)
Creatinine, Ser: 1.23 mg/dL (ref 0.61–1.24)
GFR, Estimated: >60 mL/min (ref >60)
Glucose, Bld: 137 mg/dL — ABNORMAL HIGH (ref 70–99)
Potassium: 3.9 mmol/L (ref 3.5–5.1)
Sodium: 139 mmol/L (ref 135–145)

## 2021-10-09 NOTE — Discharge Instructions (Signed)
Referral has been sent to your cardiology office. They will call and get you in for an appointment in the next 1-2 weeks. Please call the office if they do not call you by Tuesday next week. Return to ED immediately for any worsening pain.

## 2021-10-09 NOTE — ED Provider Notes (Addendum)
[facility] EMERGENCY DEPARTMENT Provider Note   CSN: [id number] Arrival date & time: 10/09/21  1605     History  Chief Complaint  Patient presents with   Chest Pain    Harry Olson is a 71 y.o. male.  Patient is a 71 year old male with past medical history of CABG on 10/16/2020 with Dr. Kipp Brood, hypertension, hypercholesteremia, and CVA for complaints of chest pain.  Patient admits to left-sided acute, severe, sharp, stabbing chest pain that began approximately 1 hour ago and lasted 45 minutes before resolution.  Patient denies any radiation.  Denies nausea, diaphoresis, or vomiting.  Patient has been compliant with his home medications including Plavix.  No other attributing symptoms at this time.  The history is provided by the patient. No language interpreter was used.  Chest Pain Associated symptoms: no abdominal pain, no back pain, no cough, no fever, no palpitations, no shortness of breath and no vomiting        Home Medications Prior to Admission medications   Medication Sig Start Date End Date Taking? Authorizing Provider  aspirin EC 325 MG EC tablet Take 1 tablet (325 mg total) by mouth daily. 10/21/20   Elgie Collard, PA-C  atorvastatin (LIPITOR) 80 MG tablet Take 1 tablet (80 mg total) by mouth daily. 10/21/20   Elgie Collard, PA-C  clopidogrel (PLAVIX) 75 MG tablet Take 75 mg by mouth daily.    [provider]  hydrOXYzine (ATARAX/VISTARIL) 25 MG tablet Take 25 mg by mouth 3 (three) times daily as needed for anxiety.     [provider]  metoprolol tartrate (LOPRESSOR) 25 MG tablet Take 1 tablet (25 mg total) by mouth 2 (two) times daily. 10/21/20   Elgie Collard, PA-C  traZODone (DESYREL) 100 MG tablet Take 200 mg by mouth at bedtime.    [provider]      Allergies    Patient has no known allergies.    Review of Systems   Review of Systems  Constitutional:  Negative for chills and fever.  HENT:  Negative for ear  pain and sore throat.   Eyes:  Negative for pain and visual disturbance.  Respiratory:  Negative for cough and shortness of breath.   Cardiovascular:  Positive for chest pain. Negative for palpitations.  Gastrointestinal:  Negative for abdominal pain and vomiting.  Genitourinary:  Negative for dysuria and hematuria.  Musculoskeletal:  Negative for arthralgias and back pain.  Skin:  Negative for color change and rash.  Neurological:  Negative for seizures and syncope.  All other systems reviewed and are negative.   Physical Exam Updated Vital Signs BP (!) 149/104   Pulse 82   Temp 98.6 F (37 C) (Oral)   Resp 19   SpO2 96%  Physical Exam Vitals and nursing note reviewed.  Constitutional:      General: He is not in acute distress.    Appearance: He is well-developed.  HENT:     Head: Normocephalic and atraumatic.  Eyes:     Conjunctiva/sclera: Conjunctivae normal.  Cardiovascular:     Rate and Rhythm: Normal rate and regular rhythm.     Pulses:          Radial pulses are 2+ on the right side and 2+ on the left side.     Heart sounds: No murmur heard. Pulmonary:     Effort: Pulmonary effort is normal. No respiratory distress.     Breath sounds: Normal breath sounds.  Abdominal:  Palpations: Abdomen is soft.     Tenderness: There is no abdominal tenderness.  Musculoskeletal:        General: No swelling.     Cervical back: Neck supple.     Right lower leg: No edema.     Left lower leg: No edema.  Skin:    General: Skin is warm and dry.     Capillary Refill: Capillary refill takes less than 2 seconds.  Neurological:     Mental Status: He is alert.  Psychiatric:        Mood and Affect: Mood normal.     ED Results / Procedures / Treatments   Labs (all labs ordered are listed, but only abnormal results are displayed) Labs Reviewed  BASIC METABOLIC PANEL - Abnormal; Notable for the following components:      Result Value   Glucose, Bld 137 (*)    All other  components within normal limits  CBC  TROPONIN I (HIGH SENSITIVITY)  TROPONIN I (HIGH SENSITIVITY)    EKG EKG Interpretation  Date/Time:  Thursday October 09 2021 16:23:28 EDT Ventricular Rate:  70 PR Interval:  166 QRS Duration: 88 QT Interval:  394 QTC Calculation: 425 R Axis:   74 Text Interpretation: Normal sinus rhythm Possible Inferior infarct , age undetermined Possible Anterior infarct , age undetermined Abnormal ECG When compared with ECG of 09-Mar-2021 20:37, PREVIOUS ECG IS PRESENT Confirmed by Campbell Stall ([id number]) on 5/46/5681 4:43:33 PM  Radiology No results found.  Procedures Procedures    Medications Ordered in ED Medications - No data to display  ED Course/ Medical Decision Making/ A&P                           Medical Decision Making Amount and/or Complexity of Data Reviewed Labs: ordered. Radiology: ordered.   72:53 PM 71 year old male with past medical history of CABG on 10/16/2020 with Dr. Kipp Brood, hypertension, hypercholesteremia, and CVA for complaints of chest pain.  Patient is alert and oriented x3, no acute distress, afebrile, stable vital signs.    The patient's chest pain is not suggestive of pulmonary embolus, cardiac ischemia, aortic dissection, pericarditis, myocarditis, pulmonary embolism, pneumothorax, pneumonia, Zoster, or esophageal perforation, or other serious etiology.  Historically not abrupt in onset, tearing or ripping, pulses symmetric. EKG nonspecific for ischemia/infarction. No dysrhythmias, brugada, WPW, prolonged QT noted. CXR reviewed and WNL. Troponin negative x2. CXR reviewed. Labs without demonstration of acute pathology unless otherwise noted above.   Heart Score 5  Chart review demonstrates: TEE echo on 10/16/2020 demonstrates EF of 45 to 50%.  Mildly reduced systolic function.  Cardiac cath 10/15/2020 op report "Good LIMA, good vein conduit.  The PDA was quite small.  Retracted back to the distal RCA but this was also  heavily calcified.  We attempted to perform an anastomosis here with the vein graft flow was not adequate.  The site was then tied off.  We then found a decent vessel in the posterior lateral branch but this too was small.  The vein graft flows are much better.  The ramus, obtuse marginal, and LAD were all good-quality vessels."  Patient had resolution of chest pain emergency department admitted for prompt follow-up with cardiologist first thing this week.  [facility] referral placed.   Detailed discussions were had with the patient regarding current findings, and need for close f/u with PCP or on call doctor. The patient has been instructed to return immediately if the symptoms  worsen in any way for re-evaluation. Patient verbalized understanding and is in agreement with current care plan. All questions answered prior to discharge.         Final Clinical Impression(s) / ED Diagnoses Final diagnoses:  Chest pain, unspecified type    Rx / DC Orders ED Discharge Orders     None         Lianne Cure, DO 49/49/44 [id number]    Campbell Stall P, DO 84/41/71 1504

## 2021-10-09 NOTE — ED Triage Notes (Signed)
Patient presents to ED via POV from home. Patient reports left sided chest pain that began today. History of quadruple bypass 1 year ago. Patient took 2 nitro tabs PTA.

## 2021-12-26 IMAGING — DX DG FOOT COMPLETE 3+V*R*
3 series · 3 of 3 positions shown · non-contrast
Comparison: None.

CLINICAL DATA: Pain.  Bilateral heel pain.

EXAM:
RIGHT FOOT COMPLETE - 3+ VIEW

[foot ap]
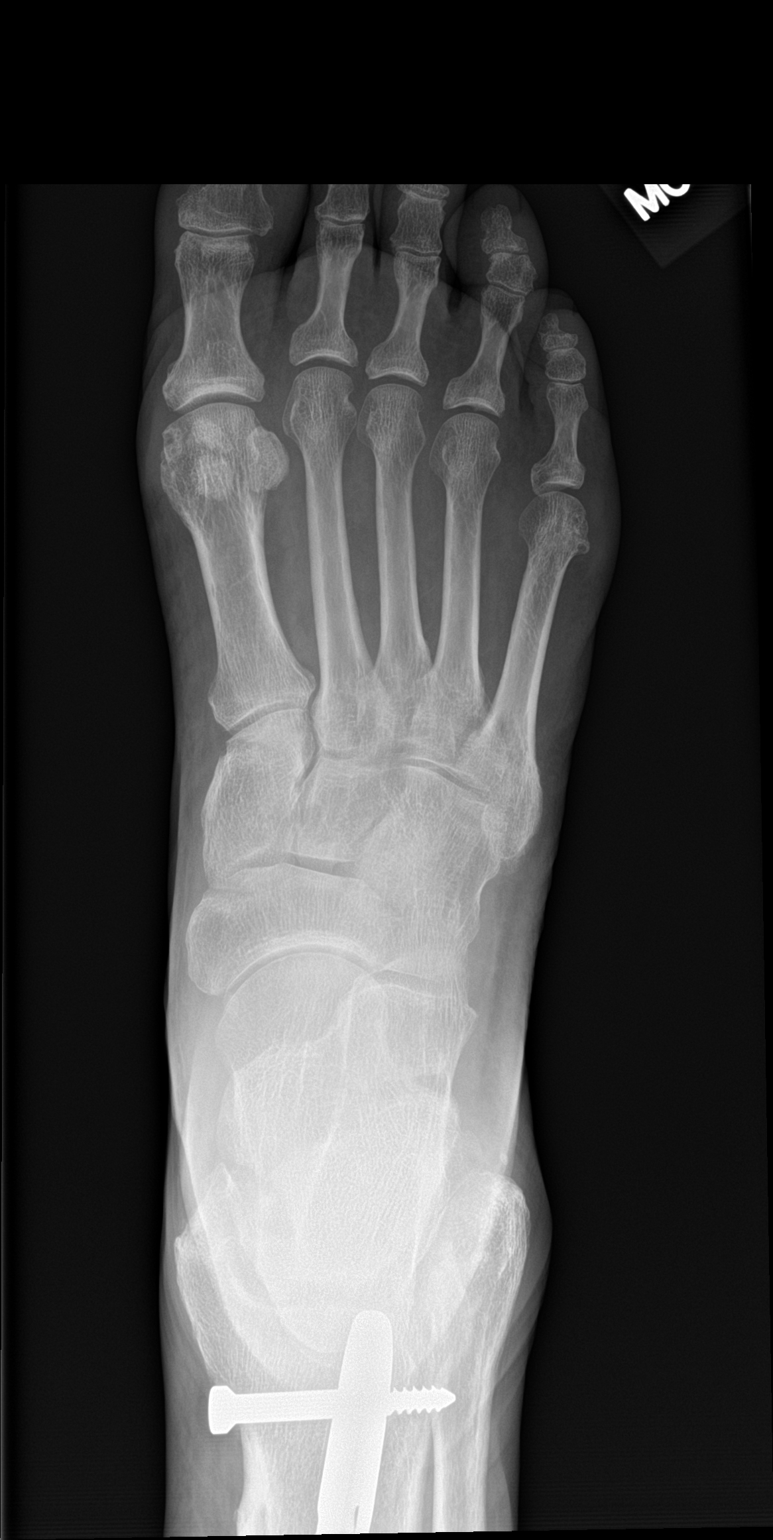

[foot obl]
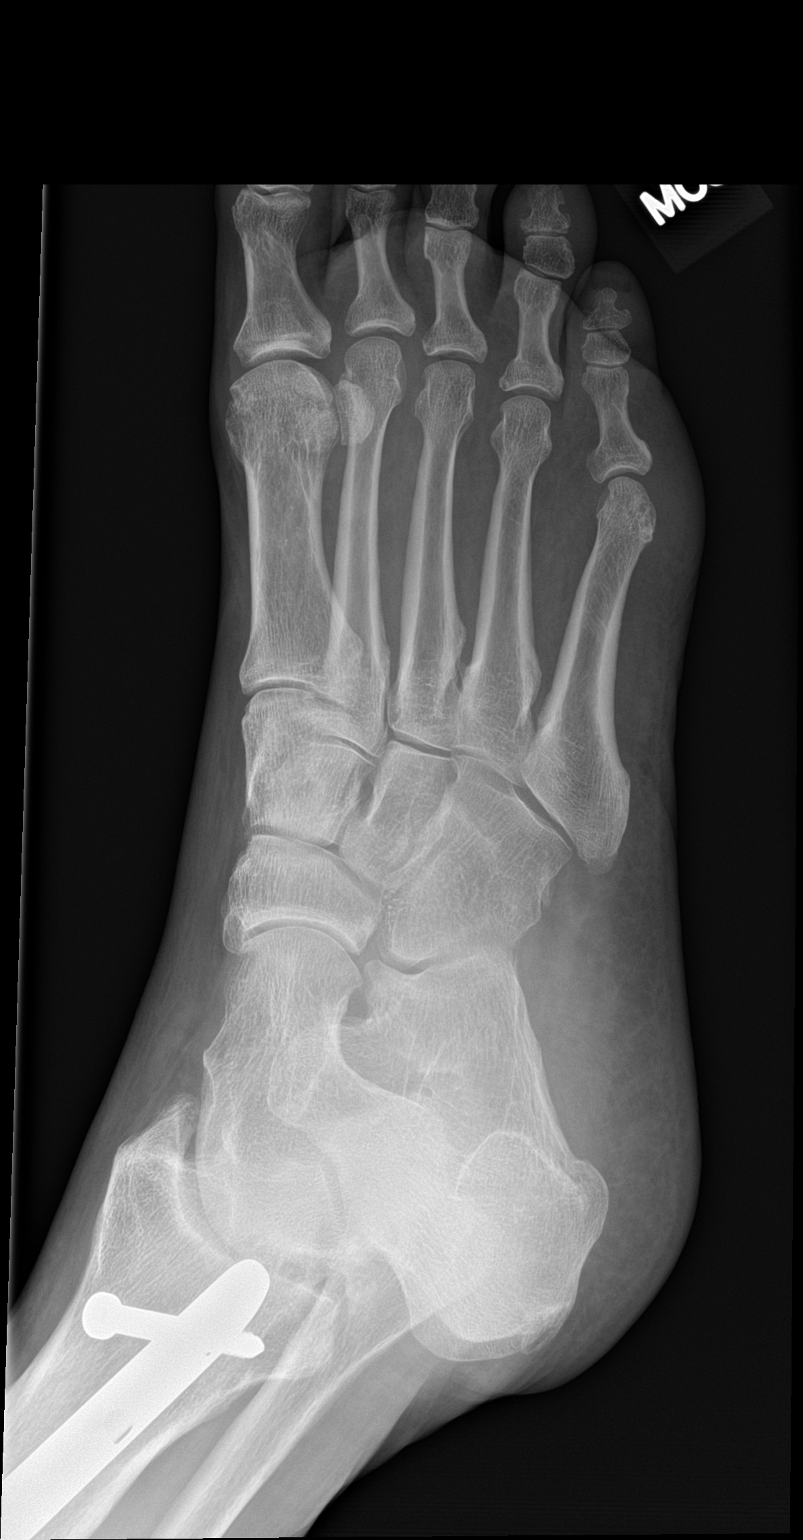

[foot lat]
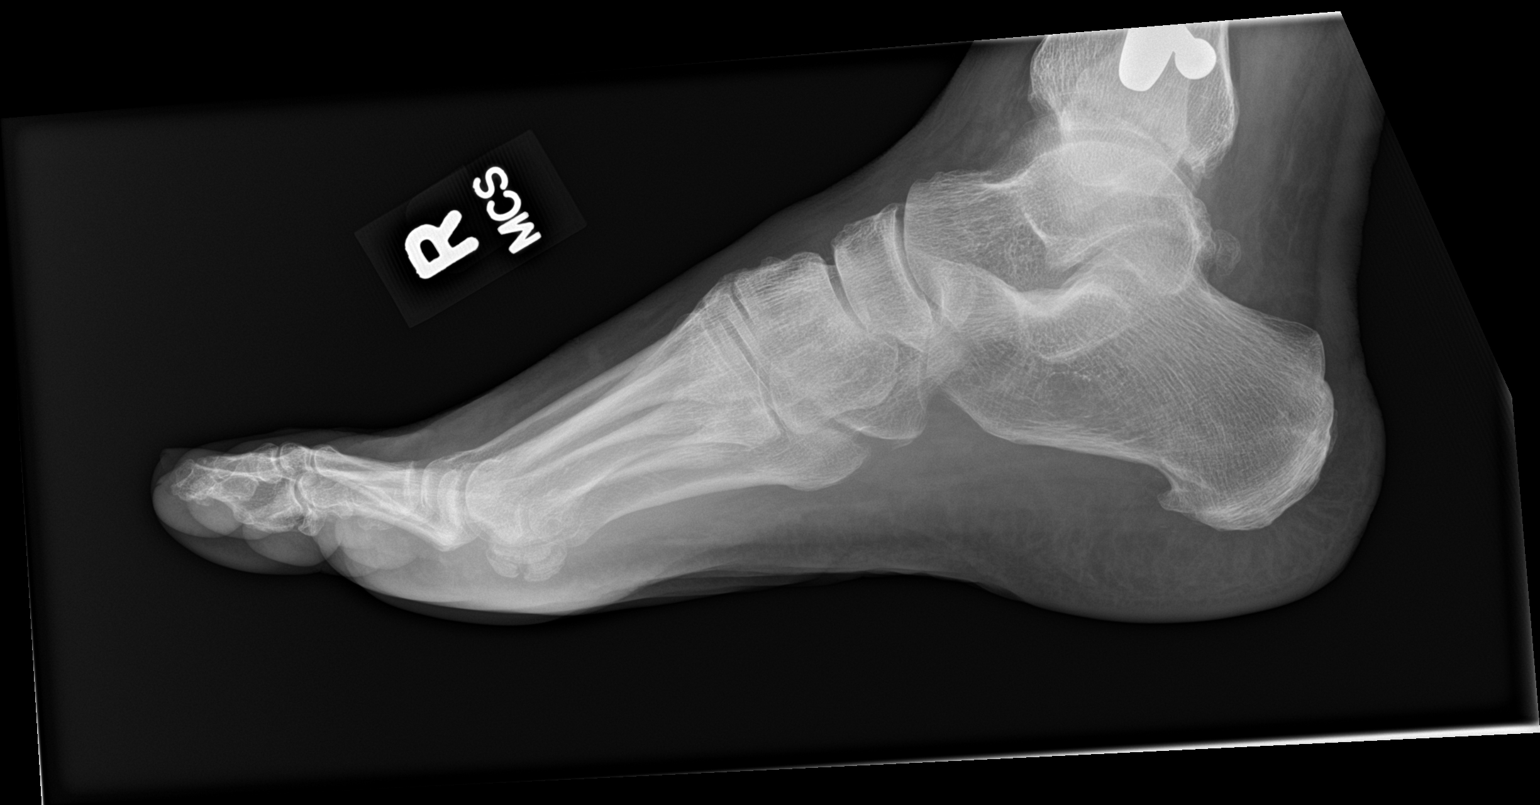

[3 of 3 positions shown; findings below may reference images not displayed]

FINDINGS: Negative for fracture or dislocation in the right foot.
Intramedullary nail with distal interlocking screw in the distal
tibia. No significant joint space narrowing or arthropathy in the
foot. Prominent plantar calcaneal spur.
IMPRESSION: 1. No acute abnormality in the right foot.
2. Calcaneal spur.

## 2022-02-22 ENCOUNTER — Emergency Department (HOSPITAL_BASED_OUTPATIENT_CLINIC_OR_DEPARTMENT_OTHER)
Admission: EM | Admit: 2022-02-22 | Discharge: 2022-02-22 | Disposition: A | Discharge status: Home / Self Care | Payer: Medicare Other | Attending: Emergency Medicine | Admitting: Emergency Medicine

## 2022-02-22 ENCOUNTER — Emergency Department (HOSPITAL_BASED_OUTPATIENT_CLINIC_OR_DEPARTMENT_OTHER): Payer: Medicare Other

## 2022-02-22 ENCOUNTER — Other Ambulatory Visit: Payer: Self-pay

## 2022-02-22 ENCOUNTER — Encounter (HOSPITAL_BASED_OUTPATIENT_CLINIC_OR_DEPARTMENT_OTHER): Payer: Self-pay | Admitting: Emergency Medicine

## 2022-02-22 DIAGNOSIS — X19XXXA Contact with other heat and hot substances, initial encounter: Secondary | ICD-10-CM | POA: Diagnosis not present

## 2022-02-22 DIAGNOSIS — T24231A Burn of second degree of right lower leg, initial encounter: Secondary | ICD-10-CM | POA: Insufficient documentation

## 2022-02-22 DIAGNOSIS — Y9389 Activity, other specified: Secondary | ICD-10-CM | POA: Diagnosis not present

## 2022-02-22 DIAGNOSIS — T24031A Burn of unspecified degree of right lower leg, initial encounter: Secondary | ICD-10-CM | POA: Diagnosis present

## 2022-02-22 DIAGNOSIS — T300 Burn of unspecified body region, unspecified degree: Secondary | ICD-10-CM

## 2022-02-22 DIAGNOSIS — T310 Burns involving less than 10% of body surface: Secondary | ICD-10-CM | POA: Diagnosis not present

## 2022-02-22 DIAGNOSIS — Z7902 Long term (current) use of antithrombotics/antiplatelets: Secondary | ICD-10-CM | POA: Diagnosis not present

## 2022-02-22 DIAGNOSIS — Z7982 Long term (current) use of aspirin: Secondary | ICD-10-CM | POA: Diagnosis not present

## 2022-02-22 DIAGNOSIS — L03115 Cellulitis of right lower limb: Secondary | ICD-10-CM | POA: Insufficient documentation

## 2022-02-22 MED ORDER — CEPHALEXIN 500 MG PO CAPS: 500.0000 mg | ORAL_CAPSULE | Freq: Four times a day (QID) | ORAL | 0 refills | Status: AC

## 2022-02-22 MED ORDER — HYDROCODONE-ACETAMINOPHEN 5-325 MG PO TABS
1.0000 | ORAL_TABLET | Freq: Once | ORAL | Status: AC
  Administered 2022-02-22: 1 via ORAL
  Filled 2022-02-22: qty 1

## 2022-02-22 MED ORDER — CEPHALEXIN 250 MG PO CAPS
500.0000 mg | ORAL_CAPSULE | Freq: Once | ORAL | Status: AC
  Administered 2022-02-22: 500 mg via ORAL
  Filled 2022-02-22: qty 2

## 2022-02-22 MED ORDER — HYDROCODONE-ACETAMINOPHEN 5-325 MG PO TABS: 1.0000 | ORAL_TABLET | Freq: Four times a day (QID) | ORAL | 0 refills | Status: AC | PRN
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
## 2022-02-22 NOTE — ED Notes (Signed)
US at bedside
